# Patient Record
Sex: Female | Born: 1955 | Race: White | Hispanic: No | Smoking: Current every day smoker
Health system: Southern US, Community
[De-identification: ages and names within clinical notes are randomized; demographics above are authoritative.]

## PROBLEM LIST (undated history)

## (undated) DIAGNOSIS — E079 Disorder of thyroid, unspecified: Secondary | ICD-10-CM

## (undated) DIAGNOSIS — E0500 Thyrotoxicosis with diffuse goiter without thyrotoxic crisis or storm: Secondary | ICD-10-CM

## (undated) DIAGNOSIS — M810 Age-related osteoporosis without current pathological fracture: Secondary | ICD-10-CM

## (undated) HISTORY — DX: Age-related osteoporosis without current pathological fracture: M81.0

---

## 2007-12-31 ENCOUNTER — Inpatient Hospital Stay (HOSPITAL_COMMUNITY): Admission: EM | Admit: 2007-12-31 | Discharge: 2008-01-01 | Payer: Self-pay | Admitting: Emergency Medicine

## 2010-10-30 NOTE — Consult Note (Signed)
Jennifer [name], Jennifer [name]            ACCOUNT NO.:  [id number]   MEDICAL RECORD NO.:  [id number]          PATIENT TYPE:  INP   LOCATION:  4707                         FACILITY:  [facility]   PHYSICIAN:  Alfonse Alpers. Gegick, M.D.DATE OF BIRTH:  1955-09-02   DATE OF CONSULTATION:  DATE OF DISCHARGE:                                 CONSULTATION   HISTORY:  This is a 55 year old woman who has noted that she has been  able to eat without gaining weight for approximately 2 months.  Approximately, 2 weeks, she has noted symptoms of palpitations and she  presents with symptoms of palpitations at this time.  She denies any  history of chest pain.  She was evaluated with a cardiac stress Jennifer [name]  which was negative.  She feels better than she previously felt.   There is no history of weight gain or weight loss.  She does have  symptoms of being able to eat without gaining weight.  She denies any  change in her eyes.  There is no history of medication-induced changes  and she has felt reasonably well previously.  Her past medical history  is essentially negative.  She has not been taking any medications.   PERSONAL HISTORY:  She smokes, but is trying to discontinue that at this  time.  (She was advised strongly important it was for her eyes not to  smoke).  She denies excessive alcoholic intake.  No history of  allergies.   REVIEW OF SYSTEMS:  Her weight has remained stable.  Cardiovascular,  respiratory see above.  GI:  No complaints.  GU:  No complaints.   PHYSICAL EXAMINATION:  GENERAL:  This is a well-developed woman who  appears in no distress at this time.  Her skin is normal.  Her head is  normocephalic.  Her eyes show slight stare to be present.  No  exophthalmos is present.  Her lungs are clear and cardiovascular rhythm  is regular.  Her abdomen is negative.  The neck is supple.  The thyroid  is slightly enlarged.  No nodules are palpable.   IMPRESSION:  Hyperthyroidism (Graves disease).   DISCUSSION:  At this time, she is going to be taking metoprolol 25 mg  b.i.d. at bedtime for her rate controlled.  She feels well and is  clinically stable.  I explained to her the various forms of therapy for  hyperthyroidism and she elected to take radioactive iodine.  She will  call the office on Monday.  Arrangements will be made for an uptake and  scan and this will be followed by a radioactive iodine therapy.           ______________________________  Alfonse Alpers. Dagoberto Ligas, M.D.     CGG/MEDQ  D:  01/01/2008  T:  01/02/2008  Job:  [id number]

## 2010-11-02 NOTE — Discharge Summary (Signed)
[name], [name] NO.:  [id number]   MEDICAL RECORD NO.:  [id number]          PATIENT TYPE:  INP   LOCATION:  4707                         FACILITY:  [facility]   PHYSICIAN:  Nanetta Batty, M.D.   DATE OF BIRTH:  [birth year]   DATE OF ADMISSION:  12/31/2007  DATE OF DISCHARGE:  01/01/2008                               DISCHARGE SUMMARY   DISCHARGE DIAGNOSES:  1. Chest pain with normal coronaries and normal left ventricular      function.  2. History of smoking.  3. Hyperthyroid by TSH, but no symptoms, seen by Dr. Corrin Parker      this admission.   HOSPITAL COURSE:  The patient is a 55 year old female with no prior  history of coronary artery disease, presented on December 31, 2007, with  chest pain worrisome for unstable angina.  She was sent by Dr. Chilton Si at  [facility].  Her symptoms are worrisome for unstable angina.  She was  seen by Dr. Clarene Duke.  Enzymes and D-dimer were negative.  EKG showed no  acute changes.  She had an exercise Myoview study on January 01, 2008,  which showed normal LV function and no ischemia.  She was seen in  consult by Dr. Dagoberto Ligas before discharge.  She was noted to be  hyperthyroid with a TSH of 0.004.  He will follow her up as an  outpatient.   DISCHARGE MEDICATIONS:  1. Metoprolol 25 mg twice a day.  2. Coated aspirin 325 mg a day.  3. Prilosec OTC once a day.   LABORATORY DATA:  EKG showed sinus rhythm without acute changes.  White  count 9.9, hemoglobin 12.7, hematocrit 37.3, and platelets 264.  INR  1.0.  Sodium 141, potassium 4.0, BUN 11, and creatinine 0.4.  LFTs were  normal.  CK-MBs were negative.  Troponins were negative.  LDL was 124.  TSH was less than 0.004.   DISPOSITION:  The patient is discharged in stable condition.  She will  follow up with Dr. Dagoberto Ligas and Dr. Nanetta Batty.      Abelino Derrick, P.A.      Nanetta Batty, M.D.  Electronically Signed    LKK/MEDQ  D:  02/15/2008  T:  02/16/2008  Job:   [id number]   cc:   Alfonse Alpers. Dagoberto Ligas, M.D.

## 2011-03-15 LAB — COMPREHENSIVE METABOLIC PANEL
ALT: 21
AST: 20
CO2: 24
Chloride: 110
Creatinine, Ser: 0.48
GFR calc Af Amer: >60
GFR calc non Af Amer: >60
Total Bilirubin: 0.5

## 2011-03-15 LAB — CBC
HCT: 37.3
HCT: 38.6
Hemoglobin: 12.7
MCHC: 34
MCV: 85.2
Platelets: 290
RDW: 13.3
WBC: 8.9

## 2011-03-15 LAB — CARDIAC PANEL(CRET KIN+CKTOT+MB+TROPI)
CK, MB: 2.1
Relative Index: INVALID
Total CK: 87
Troponin I: <0.01

## 2011-03-15 LAB — DIFFERENTIAL
Basophils Absolute: 0.1
Basophils Relative: 1
Eosinophils Absolute: 0.2
Eosinophils Relative: 2
Lymphocytes Relative: 40

## 2011-03-15 LAB — HEPARIN LEVEL (UNFRACTIONATED): Heparin Unfractionated: 0.36

## 2011-03-15 LAB — LIPID PANEL
HDL: 42
Total CHOL/HDL Ratio: 4.4
VLDL: 19

## 2011-03-15 LAB — D-DIMER, QUANTITATIVE: D-Dimer, Quant: 0.27

## 2012-09-14 ENCOUNTER — Encounter (HOSPITAL_COMMUNITY): Payer: Self-pay | Admitting: *Deleted

## 2012-09-14 ENCOUNTER — Emergency Department (HOSPITAL_COMMUNITY)
Admission: EM | Admit: 2012-09-14 | Discharge: 2012-09-14 | Disposition: A | Discharge status: Home / Self Care | Payer: Self-pay | Attending: Emergency Medicine | Admitting: Emergency Medicine

## 2012-09-14 DIAGNOSIS — Z862 Personal history of diseases of the blood and blood-forming organs and certain disorders involving the immune mechanism: Secondary | ICD-10-CM | POA: Insufficient documentation

## 2012-09-14 DIAGNOSIS — M25519 Pain in unspecified shoulder: Secondary | ICD-10-CM | POA: Insufficient documentation

## 2012-09-14 DIAGNOSIS — F17200 Nicotine dependence, unspecified, uncomplicated: Secondary | ICD-10-CM | POA: Insufficient documentation

## 2012-09-14 DIAGNOSIS — M25511 Pain in right shoulder: Secondary | ICD-10-CM

## 2012-09-14 DIAGNOSIS — Z8639 Personal history of other endocrine, nutritional and metabolic disease: Secondary | ICD-10-CM | POA: Insufficient documentation

## 2012-09-14 HISTORY — DX: Disorder of thyroid, unspecified: E07.9

## 2012-09-14 MED ORDER — OXYCODONE-ACETAMINOPHEN 5-325 MG PO TABS: 1.0000 | ORAL_TABLET | ORAL | Status: DC | PRN

## 2012-09-14 NOTE — ED Notes (Signed)
Three months ago she began experiencing shoulder pain.  She is able to function but it is getting progressively worse.  The patent said now the pain is radiating down her right arm and her right leg.  She is able to move her right arm but has limited range of motion because it hurts.  She denies injury.

## 2012-09-14 NOTE — ED Notes (Signed)
Pt woke up 1 morning 3 months ago with R shoulder pain.  Since then it is becoming increasingly painful, esp when she raises her arm.

## 2012-09-14 NOTE — ED Provider Notes (Signed)
History  This chart was scribed for Dione Booze, MD by Shari Heritage, ED Scribe. The patient was seen in room TR09C/TR09C. Patient's care was started at 1616.  CSN: [id number]  Arrival date & time 09/14/12  1527   First MD Initiated Contact with Patient 09/14/12 1616      Chief Complaint  Patient presents with  . Shoulder Pain    muscular     The history is provided by the patient. No language interpreter was used.    HPI Comments: Jennifer [name] is a 57 y.o. female who presents to the Emergency Department complaining of moderate, waxing and waning, right shoulder pain that radiates down her arm onset 1-2 days ago. Patient states that she has had this recurrent pain for the past 3 months, but it has worsened over the past couple of days. Patient rates pain as 3/10 at current. At worst, pain is 8/10. Pain is worse with movement of the shoulder. She says that sometimes pain is so severe that it wakes her from sleep at night. She denies any obvious injury or trauma prior to the onset of pain. Patient says that she has taken Tylenol at home with only minimal relief. There is no nausea, vomiting, fever, chills, numbness or weakness. Patient is a current every day smoker.    Past Medical History  Diagnosis Date  . Thyroid disease     graves    History reviewed. No pertinent past surgical history.  No family history on file.  History  Substance Use Topics  . Smoking status: Current Every Day Smoker -- 1.50 packs/day    Types: Cigarettes  . Smokeless tobacco: Not on file  . Alcohol Use: Yes     Comment: occasionally    OB History   Grav Para Term Preterm Abortions TAB SAB Ect Mult Living                  Review of Systems  Constitutional: Negative for fever and chills.  Gastrointestinal: Negative for nausea and vomiting.  Neurological: Negative for weakness and numbness.    Allergies  Review of patient's allergies indicates no known allergies.  Home Medications  No  current outpatient prescriptions on file.  Triage Vitals: BP 159/75  Pulse 96  Temp(Src) 98.8 F (37.1 C) (Oral)  Resp 16  SpO2 98%  Physical Exam  Constitutional: She is oriented to person, place, and time. She appears well-developed and well-nourished.  HENT:  Head: Normocephalic and atraumatic.  Eyes: Conjunctivae and EOM are normal. Pupils are equal, round, and reactive to light.  Neck: Normal range of motion. Neck supple.  Cardiovascular: Normal rate, regular rhythm and normal heart sounds.   Pulmonary/Chest: Effort normal and breath sounds normal.  Musculoskeletal:       Right shoulder: She exhibits tenderness and pain.  Mild tenderness intramedial aspect of the right humeral head. Pain and marked limitation of ROM with both passive and active range of motion of right shoulder. Neurovascularly intact distally.  Neurological: She is alert and oriented to person, place, and time.  Skin: Skin is warm and dry.  Psychiatric: She has a normal mood and affect. Her behavior is normal.    ED Course  Procedures (including critical care time) DIAGNOSTIC STUDIES: Oxygen Saturation is 898% on room air, normal by my interpretation.    COORDINATION OF CARE: 4:23 PM- Patient informed of current plan for treatment and evaluation and agrees with plan at this time.    1. Pain in right shoulder  MDM  Shoulder pain with limitation in active and passive range of motion most consistent with adhesive capsulitis. I do not see indications for imaging at this time. She's advised to take over-the-counter naproxen and is given prescription for Percocet and is referred to on-call orthopedics.      I personally performed the services described in this documentation, which was scribed in my presence. The recorded information has been reviewed and is accurate.      Dione Booze, MD 09/14/12 1630

## 2012-11-26 ENCOUNTER — Emergency Department (HOSPITAL_COMMUNITY): Payer: Self-pay

## 2012-11-26 ENCOUNTER — Emergency Department (HOSPITAL_COMMUNITY)
Admission: EM | Admit: 2012-11-26 | Discharge: 2012-11-26 | Disposition: A | Discharge status: Home / Self Care | Payer: Self-pay | Attending: Emergency Medicine | Admitting: Emergency Medicine

## 2012-11-26 ENCOUNTER — Encounter (HOSPITAL_COMMUNITY): Payer: Self-pay | Admitting: Emergency Medicine

## 2012-11-26 DIAGNOSIS — R110 Nausea: Secondary | ICD-10-CM | POA: Insufficient documentation

## 2012-11-26 DIAGNOSIS — R002 Palpitations: Secondary | ICD-10-CM | POA: Insufficient documentation

## 2012-11-26 DIAGNOSIS — F17200 Nicotine dependence, unspecified, uncomplicated: Secondary | ICD-10-CM | POA: Insufficient documentation

## 2012-11-26 DIAGNOSIS — R0602 Shortness of breath: Secondary | ICD-10-CM | POA: Insufficient documentation

## 2012-11-26 DIAGNOSIS — E0500 Thyrotoxicosis with diffuse goiter without thyrotoxic crisis or storm: Secondary | ICD-10-CM | POA: Insufficient documentation

## 2012-11-26 DIAGNOSIS — Z862 Personal history of diseases of the blood and blood-forming organs and certain disorders involving the immune mechanism: Secondary | ICD-10-CM | POA: Insufficient documentation

## 2012-11-26 DIAGNOSIS — Z8639 Personal history of other endocrine, nutritional and metabolic disease: Secondary | ICD-10-CM | POA: Insufficient documentation

## 2012-11-26 HISTORY — DX: Thyrotoxicosis with diffuse goiter without thyrotoxic crisis or storm: E05.00

## 2012-11-26 LAB — BASIC METABOLIC PANEL
BUN: 7 mg/dL (ref 6–23)
Chloride: 105 mEq/L (ref 96–112)
GFR calc Af Amer: >90 mL/min (ref >90)
GFR calc non Af Amer: >90 mL/min (ref >90)
Potassium: 4.3 mEq/L (ref 3.5–5.1)
Sodium: 137 mEq/L (ref 135–145)

## 2012-11-26 LAB — URINALYSIS, ROUTINE W REFLEX MICROSCOPIC
Leukocytes, UA: NEGATIVE
Nitrite: NEGATIVE
Specific Gravity, Urine: 1.01 (ref 1.005–1.030)
Urobilinogen, UA: 0.2 mg/dL (ref 0.0–1.0)

## 2012-11-26 LAB — TSH: TSH: 0.02 u[IU]/mL — ABNORMAL LOW (ref 0.350–4.500)

## 2012-11-26 LAB — CBC WITH DIFFERENTIAL/PLATELET
Basophils Relative: 0 % (ref 0–1)
Hemoglobin: 14.3 g/dL (ref 12.0–15.0)
MCHC: 34.8 g/dL (ref 30.0–36.0)
Monocytes Relative: 7 % (ref 3–12)
Neutro Abs: 11.3 10*3/uL — ABNORMAL HIGH (ref 1.7–7.7)
Neutrophils Relative %: 72 % (ref 43–77)
Platelets: 307 10*3/uL (ref 150–400)
RBC: 4.61 MIL/uL (ref 3.87–5.11)

## 2012-11-26 LAB — D-DIMER, QUANTITATIVE: D-Dimer, Quant: <0.27 ug/mL-FEU (ref 0.00–0.48)

## 2012-11-26 NOTE — ED Notes (Signed)
Pt reports rapid heart rate X1 week and had associated midchest pain. Rapid heart rate increases in morning.  Pt has hx of same but has not been taking her meds.  Pt is currently pain free.

## 2012-11-26 NOTE — ED Provider Notes (Signed)
History     CSN: [id number]  Arrival date & time 11/26/12  8469   First MD Initiated Contact with Patient 11/26/12 0840      Chief Complaint  Patient presents with  . Chest Pain    (Consider location/radiation/quality/duration/timing/severity/associated sxs/prior treatment) HPI Comments: Patient is a 57 year old female who presents today with one week of palpitations with 30 minutes of associated sharp, nonradiating substernal chest pain earlier today. Associated with the chest pain that was nausea, shortness of breath. She has a history of hyperthyroidism and does not take her medications because she states she cannot afford them. She had a cardiac workup with echo and stress test 5 years ago which was negative. No weight loss, hair loss, fevers, chills, recent illness. No vomiting or abdominal pain.  Patient is a 57 y.o. female presenting with chest pain. The history is provided by the patient. No language interpreter was used.  Chest Pain Associated symptoms: shortness of breath   Associated symptoms: no fever     Past Medical History  Diagnosis Date  . Thyroid disease     graves  . Graves disease     History reviewed. No pertinent past surgical history.  History reviewed. No pertinent family history.  History  Substance Use Topics  . Smoking status: Current Every Day Smoker -- 1.50 packs/day    Types: Cigarettes  . Smokeless tobacco: Not on file  . Alcohol Use: Yes     Comment: occasionally    OB History   Grav Para Term Preterm Abortions TAB SAB Ect Mult Living                  Review of Systems  Constitutional: Negative for fever, chills and unexpected weight change.  Respiratory: Positive for shortness of breath.   Cardiovascular: Positive for chest pain.  All other systems reviewed and are negative.    Allergies  Review of patient's allergies indicates no known allergies.  Home Medications   Current Outpatient Rx  Name  Route  Sig  Dispense   Refill  . oxyCODONE-acetaminophen (PERCOCET/ROXICET) 5-325 MG per tablet   Oral   Take 1 tablet by mouth every 4 (four) hours as needed for pain.   20 tablet   0     BP 156/117  Pulse 86  Temp(Src) 98.2 F (36.8 C) (Oral)  Resp 18  SpO2 96%  Physical Exam  Nursing note and vitals reviewed. Constitutional: She is oriented to person, place, and time. She appears well-developed and well-nourished. No distress.  HENT:  Head: Normocephalic and atraumatic.  Right Ear: External ear normal.  Left Ear: External ear normal.  Nose: Nose normal.  Mouth/Throat: Oropharynx is clear and moist.  Eyes: Conjunctivae are normal.  Neck: Normal range of motion.  Cardiovascular: Normal rate, regular rhythm and normal heart sounds.   Pulmonary/Chest: Effort normal and breath sounds normal. No stridor. No respiratory distress. She has no wheezes. She has no rales.  Abdominal: Soft. She exhibits no distension. There is tenderness in the left lower quadrant.  Musculoskeletal: Normal range of motion.  Neurological: She is alert and oriented to person, place, and time. She has normal strength.  Skin: Skin is warm and dry. She is not diaphoretic. No erythema.  Psychiatric: She has a normal mood and affect. Her behavior is normal.    ED Course  Procedures (including critical care time)  Labs Reviewed  CBC WITH DIFFERENTIAL - Abnormal; Notable for the following:    WBC 15.6 (*)  Neutro Abs 11.3 (*)    Monocytes Absolute 1.1 (*)    All other components within normal limits  BASIC METABOLIC PANEL  D-DIMER, QUANTITATIVE  URINALYSIS, ROUTINE W REFLEX MICROSCOPIC  TSH  POCT I-STAT TROPONIN I   Dg Chest 2 View  11/26/2012   *RADIOLOGY REPORT*  Clinical Data: Sternal chest pain.  CHEST - 2 VIEW  Comparison: 12/31/2007.  Findings: Trachea is midline. Heart size normal.  Lungs are hyperinflated but clear.  No pleural fluid. Sternum is unremarkable.  IMPRESSION: Hyperinflation without acute finding.    Original Report Authenticated By: Leanna Battles, M.D.     Date: 11/26/2012  Rate: 90  Rhythm: normal sinus rhythm and premature ventricular contractions (PVC)  QRS Axis: normal  Intervals: normal  ST/T Wave abnormalities: normal  Conduction Disutrbances:none  Narrative Interpretation:   Old EKG Reviewed: unchanged    1. Graves disease   2. Palpitations       MDM  Patient presents with chest pain since this morning and palpitations x 1 week. Hyperthyroid without taking medication as she states she cannot afford it. States she has felt like this is the past. Neg stress test 5 years ago. Today CXR, troponin, d-dimer negative. WBC high. UA negative. It is very important to establish care with a pcp. Cardio consult given. Make appointment for outpatient follow up. Return instructions given. Vital signs stable for dishcarge. Dr. Ranae Palms evaluated patient and agrees with plan. Patient / Family / Caregiver informed of clinical course, understand medical decision-making process, and agree with plan.         Mora Bellman, PA-C 11/26/12 1531

## 2012-11-26 NOTE — ED Notes (Signed)
Pt c/o palpitations x 1 week intermittently; pt sts some mid sternal CP this am

## 2012-11-26 NOTE — ED Provider Notes (Signed)
Medical screening examination/treatment/procedure(s) were conducted as a shared visit with non-physician practitioner(s) and myself.  I personally evaluated the patient during the encounter   Loren Racer, MD 11/26/12 1557

## 2012-11-26 NOTE — ED Notes (Signed)
Patient transported to X-ray 

## 2022-01-02 ENCOUNTER — Ambulatory Visit (INDEPENDENT_AMBULATORY_CARE_PROVIDER_SITE_OTHER): Payer: Medicare HMO | Admitting: Registered Nurse

## 2022-01-02 ENCOUNTER — Encounter: Payer: Self-pay | Admitting: Registered Nurse

## 2022-01-02 VITALS — BP 136/82 | HR 70 | Temp 97.3°F | Resp 16 | Ht 65.0 in | Wt 124.4 lb

## 2022-01-02 DIAGNOSIS — Z1231 Encounter for screening mammogram for malignant neoplasm of breast: Secondary | ICD-10-CM

## 2022-01-02 DIAGNOSIS — Z800 Family history of malignant neoplasm of digestive organs: Secondary | ICD-10-CM

## 2022-01-02 DIAGNOSIS — E2839 Other primary ovarian failure: Secondary | ICD-10-CM | POA: Diagnosis not present

## 2022-01-02 DIAGNOSIS — R5382 Chronic fatigue, unspecified: Secondary | ICD-10-CM | POA: Diagnosis not present

## 2022-01-02 DIAGNOSIS — Z87891 Personal history of nicotine dependence: Secondary | ICD-10-CM

## 2022-01-02 DIAGNOSIS — Z8639 Personal history of other endocrine, nutritional and metabolic disease: Secondary | ICD-10-CM

## 2022-01-02 LAB — URINALYSIS, ROUTINE W REFLEX MICROSCOPIC
Bilirubin Urine: NEGATIVE
Ketones, ur: NEGATIVE
Nitrite: NEGATIVE
Specific Gravity, Urine: 1.015 (ref 1.000–1.030)
Total Protein, Urine: NEGATIVE
Urine Glucose: NEGATIVE
Urobilinogen, UA: 0.2 (ref 0.0–1.0)
pH: 6 (ref 5.0–8.0)

## 2022-01-02 LAB — LIPID PANEL
Cholesterol: 200 mg/dL (ref 0–200)
HDL: 53.8 mg/dL (ref >39.00)
LDL Cholesterol: 130 mg/dL — ABNORMAL HIGH (ref 0–99)
NonHDL: 146.45
Total CHOL/HDL Ratio: 4
Triglycerides: 82 mg/dL (ref 0.0–149.0)
VLDL: 16.4 mg/dL (ref 0.0–40.0)

## 2022-01-02 LAB — CBC WITH DIFFERENTIAL/PLATELET
Basophils Absolute: 0.1 10*3/uL (ref 0.0–0.1)
Basophils Relative: 1 % (ref 0.0–3.0)
Eosinophils Absolute: 0.1 10*3/uL (ref 0.0–0.7)
Eosinophils Relative: 1.5 % (ref 0.0–5.0)
HCT: 43.4 % (ref 36.0–46.0)
Hemoglobin: 14.4 g/dL (ref 12.0–15.0)
Lymphocytes Relative: 21.8 % (ref 12.0–46.0)
Lymphs Abs: 2.2 10*3/uL (ref 0.7–4.0)
MCHC: 33.2 g/dL (ref 30.0–36.0)
MCV: 92.5 fl (ref 78.0–100.0)
Monocytes Absolute: 0.7 10*3/uL (ref 0.1–1.0)
Monocytes Relative: 6.9 % (ref 3.0–12.0)
Neutro Abs: 6.8 10*3/uL (ref 1.4–7.7)
Neutrophils Relative %: 68.8 % (ref 43.0–77.0)
Platelets: 289 10*3/uL (ref 150.0–400.0)
RBC: 4.69 Mil/uL (ref 3.87–5.11)
RDW: 13.8 % (ref 11.5–15.5)
WBC: 9.9 10*3/uL (ref 4.0–10.5)

## 2022-01-02 LAB — COMPREHENSIVE METABOLIC PANEL
ALT: 15 U/L (ref 0–35)
AST: 20 U/L (ref 0–37)
Albumin: 4.6 g/dL (ref 3.5–5.2)
Alkaline Phosphatase: 57 U/L (ref 39–117)
BUN: 13 mg/dL (ref 6–23)
CO2: 26 mEq/L (ref 19–32)
Calcium: 9.3 mg/dL (ref 8.4–10.5)
Chloride: 105 mEq/L (ref 96–112)
Creatinine, Ser: 0.65 mg/dL (ref 0.40–1.20)
GFR: 92.61 mL/min (ref >60.00)
Glucose, Bld: 99 mg/dL (ref 70–99)
Potassium: 3.6 mEq/L (ref 3.5–5.1)
Sodium: 139 mEq/L (ref 135–145)
Total Bilirubin: 0.9 mg/dL (ref 0.2–1.2)
Total Protein: 7 g/dL (ref 6.0–8.3)

## 2022-01-02 LAB — TSH: TSH: 0.09 u[IU]/mL — ABNORMAL LOW (ref 0.35–5.50)

## 2022-01-02 LAB — HEMOGLOBIN A1C: Hgb A1c MFr Bld: 5.7 % (ref 4.6–6.5)

## 2022-01-02 LAB — T4, FREE: Free T4: 1.18 ng/dL (ref 0.60–1.60)

## 2022-01-02 NOTE — Patient Instructions (Signed)
Jennifer [name] -   Pleasure to meet you  Looking forward to figuring out what's up with this fatigue!  I'll let you know how lab results look as soon as I have results - should be this afternoon.  We'll plan on follow up from there.  You will get phone calls about colonoscopy, mammogram, dexa scan (bone density), and lung cancer screening.   Thank you!  Rich

## 2022-01-02 NOTE — Progress Notes (Signed)
New Patient Office Visit  Subjective:  Patient ID: Jennifer [name], female    DOB: 24-Sep-1955  Age: 66 y.o. MRN: [id number]  CC:  Chief Complaint  Patient presents with   Establish Care    Est care  Pt states she has Graves disease  Fatigued more than usual     HPI Jennifer [name] presents to establish care Histories reviewed and updated with patient.   Fatigue Hx of Graves Disease Has felt more fatigue around 6 mo ago, worsened around 3 mo ago.  Feels different than history of thyroid dysfunction. Denies weight changes. Does note she is overdue for glasses rx update - does have some headaches. She does endorse some diarrhea but no blood or mucus in stool.   No other symptoms of note  Does have 50 pack year smoking history, active smoker. Her father was dx with colon cancer.  No outpatient encounter medications on file as of 01/02/2022.   No facility-administered encounter medications on file as of 01/02/2022.    Past Medical History:  Diagnosis Date   Graves disease    Graves disease    Thyroid disease    graves    History reviewed. No pertinent surgical history.  Family History  Problem Relation Age of Onset   Cancer - Colon Mother    Alzheimer's disease Mother    Heart disease Father    Cancer - Lung Father     Social History   Socioeconomic History   Marital status: Divorced    Spouse name: Not on file   Number of children: Not on file   Years of education: Not on file   Highest education level: Not on file  Occupational History   Not on file  Tobacco Use   Smoking status: Every Day    Packs/day: 1.50    Types: Cigarettes   Smokeless tobacco: Not on file  Substance and Sexual Activity   Alcohol use: Yes    Alcohol/week: 2.0 standard drinks of alcohol    Types: 2 Cans of beer per week    Comment: occasionally   Drug use: No   Sexual activity: Not Currently  Other Topics Concern   Not on file  Social History Narrative   Not on file    Social Determinants of Health   Financial Resource Strain: Not on file  Food Insecurity: Not on file  Transportation Needs: Not on file  Physical Activity: Not on file  Stress: Not on file  Social Connections: Not on file  Intimate Partner Violence: Not on file    ROS Review of Systems  Constitutional: Negative.   HENT: Negative.    Eyes: Negative.   Respiratory: Negative.    Cardiovascular: Negative.   Gastrointestinal: Negative.   Genitourinary: Negative.   Musculoskeletal: Negative.   Skin: Negative.   Neurological: Negative.   Psychiatric/Behavioral: Negative.    All other systems reviewed and are negative.   Objective:   Today's Vitals: BP 136/82   Pulse 70   Temp (!) 97.3 F (36.3 C) (Temporal)   Resp 16   Ht '5\' 5"'$  (1.651 m)   Wt 124 lb 6 oz (56.4 kg)   SpO2 98%   BMI 20.70 kg/m   Physical Exam Vitals and nursing note reviewed.  Constitutional:      General: She is not in acute distress.    Appearance: Normal appearance. She is normal weight. She is not ill-appearing, toxic-appearing or diaphoretic.  Neck:     Vascular: No carotid  bruit.  Cardiovascular:     Rate and Rhythm: Normal rate and regular rhythm.     Heart sounds: Normal heart sounds. No murmur heard.    No friction rub. No gallop.  Pulmonary:     Effort: Pulmonary effort is normal. No respiratory distress.     Breath sounds: No stridor. Wheezing (panlobular) present. No rhonchi or rales.  Chest:     Chest wall: No tenderness.  Musculoskeletal:     Cervical back: Normal range of motion and neck supple. No rigidity or tenderness.  Lymphadenopathy:     Cervical: No cervical adenopathy.  Skin:    General: Skin is warm and dry.  Neurological:     General: No focal deficit present.     Mental Status: She is alert and oriented to person, place, and time. Mental status is at baseline.  Psychiatric:        Mood and Affect: Mood normal.        Behavior: Behavior normal.        Thought  Content: Thought content normal.        Judgment: Judgment normal.         Assessment & Plan:   Problem List Items Addressed This Visit   None   Follow-up: No follow-ups on file.   PLAN - panlobular wheezing consistent with suspected COPD. Will refer to lung ca screening. - thyroid normal on exam. Will check labs. No previous results available. - Refer to screening colonoscopy - order screening mammography and dexa scan - Labs collected. Will follow up with the patient as warranted. Patient encouraged to call clinic with any questions, comments, or concerns.   Maximiano Coss, NP

## 2022-01-15 HISTORY — PX: OTHER SURGICAL HISTORY: SHX169

## 2022-02-05 ENCOUNTER — Other Ambulatory Visit: Payer: Self-pay | Admitting: Registered Nurse

## 2022-02-05 ENCOUNTER — Ambulatory Visit
Admission: RE | Admit: 2022-02-05 | Discharge: 2022-02-05 | Disposition: A | Discharge status: Home / Self Care | Payer: Medicare HMO | Source: Ambulatory Visit | Attending: Registered Nurse | Admitting: Registered Nurse

## 2022-02-05 DIAGNOSIS — Z1231 Encounter for screening mammogram for malignant neoplasm of breast: Secondary | ICD-10-CM

## 2022-02-05 DIAGNOSIS — M8588 Other specified disorders of bone density and structure, other site: Secondary | ICD-10-CM | POA: Diagnosis not present

## 2022-02-05 DIAGNOSIS — M810 Age-related osteoporosis without current pathological fracture: Secondary | ICD-10-CM | POA: Diagnosis not present

## 2022-02-05 DIAGNOSIS — Z780 Asymptomatic menopausal state: Secondary | ICD-10-CM | POA: Diagnosis not present

## 2022-02-05 DIAGNOSIS — E2839 Other primary ovarian failure: Secondary | ICD-10-CM

## 2022-02-06 ENCOUNTER — Telehealth: Payer: Self-pay

## 2022-02-06 ENCOUNTER — Other Ambulatory Visit: Payer: Self-pay | Admitting: Family Medicine

## 2022-02-06 MED ORDER — ALENDRONATE SODIUM 70 MG PO TABS: 70.0000 mg | ORAL_TABLET | ORAL | 11 refills | Status: DC

## 2022-02-06 MED ORDER — ALENDRONATE SODIUM 70 MG PO TABS: 70.0000 mg | ORAL_TABLET | ORAL | 3 refills | Status: AC

## 2022-02-06 NOTE — Telephone Encounter (Signed)
Pt called back and informed.

## 2022-02-06 NOTE — Telephone Encounter (Signed)
Fosamax sent for pt

## 2022-02-06 NOTE — Telephone Encounter (Signed)
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  Based on this, we will start Fosamax '70mg'$  weekly and make sure you are taking a Vit D and Calcium supplement daily.

## 2022-02-08 ENCOUNTER — Other Ambulatory Visit: Payer: Self-pay | Admitting: Registered Nurse

## 2022-02-08 ENCOUNTER — Encounter: Payer: Self-pay | Admitting: Gastroenterology

## 2022-02-08 DIAGNOSIS — R928 Other abnormal and inconclusive findings on diagnostic imaging of breast: Secondary | ICD-10-CM

## 2022-02-21 ENCOUNTER — Other Ambulatory Visit: Payer: Self-pay

## 2022-02-21 ENCOUNTER — Ambulatory Visit (AMBULATORY_SURGERY_CENTER): Payer: Self-pay

## 2022-02-21 VITALS — Ht 65.0 in | Wt 128.0 lb

## 2022-02-21 DIAGNOSIS — Z1211 Encounter for screening for malignant neoplasm of colon: Secondary | ICD-10-CM

## 2022-02-21 MED ORDER — NA SULFATE-K SULFATE-MG SULF 17.5-3.13-1.6 GM/177ML PO SOLN: 1.0000 | Freq: Once | ORAL | 0 refills | Status: AC

## 2022-02-21 NOTE — Progress Notes (Signed)
Denies allergies to eggs or soy products. Denies complication of anesthesia or sedation. Denies use of weight loss medication. Denies use of O2.   Emmi instructions given for colonoscopy.  

## 2022-02-25 ENCOUNTER — Telehealth: Payer: Self-pay | Admitting: Gastroenterology

## 2022-02-25 DIAGNOSIS — Z1211 Encounter for screening for malignant neoplasm of colon: Secondary | ICD-10-CM

## 2022-02-25 MED ORDER — PEG 3350-KCL-NA BICARB-NACL 420 G PO SOLR: 4000.0000 mL | Freq: Once | ORAL | 0 refills | Status: AC

## 2022-02-25 NOTE — Telephone Encounter (Signed)
Inbound call from patient stating she was not able to get her prep from the pharmacy and that they mentioned that she needs a alternative sent in. Please advise.

## 2022-02-25 NOTE — Telephone Encounter (Signed)
I spoke with the patient and explained that she may use good rx for coupon to bring cost to $30-40, pt states that would be a problem. Switched prep to [employer] rx sent and new instructions mailed to pt. Pt is aware, she will call us with any questions.

## 2022-02-28 DIAGNOSIS — E0580 Other thyrotoxicosis without thyrotoxic crisis or storm: Secondary | ICD-10-CM | POA: Diagnosis not present

## 2022-02-28 DIAGNOSIS — H2513 Age-related nuclear cataract, bilateral: Secondary | ICD-10-CM | POA: Diagnosis not present

## 2022-02-28 DIAGNOSIS — H348112 Central retinal vein occlusion, right eye, stable: Secondary | ICD-10-CM | POA: Diagnosis not present

## 2022-03-05 ENCOUNTER — Encounter: Payer: Self-pay | Admitting: Gastroenterology

## 2022-03-06 ENCOUNTER — Ambulatory Visit: Admission: RE | Admit: 2022-03-06 | Payer: Medicare HMO | Source: Ambulatory Visit

## 2022-03-06 ENCOUNTER — Ambulatory Visit: Payer: Medicare HMO

## 2022-03-06 ENCOUNTER — Ambulatory Visit
Admission: RE | Admit: 2022-03-06 | Discharge: 2022-03-06 | Disposition: A | Discharge status: Home / Self Care | Payer: Medicare HMO | Source: Ambulatory Visit | Attending: Registered Nurse | Admitting: Registered Nurse

## 2022-03-06 ENCOUNTER — Other Ambulatory Visit: Payer: Self-pay | Admitting: Registered Nurse

## 2022-03-06 DIAGNOSIS — R921 Mammographic calcification found on diagnostic imaging of breast: Secondary | ICD-10-CM | POA: Diagnosis not present

## 2022-03-06 DIAGNOSIS — R928 Other abnormal and inconclusive findings on diagnostic imaging of breast: Secondary | ICD-10-CM

## 2022-03-20 ENCOUNTER — Ambulatory Visit (AMBULATORY_SURGERY_CENTER): Payer: Medicare HMO | Admitting: Gastroenterology

## 2022-03-20 ENCOUNTER — Encounter: Payer: Self-pay | Admitting: Gastroenterology

## 2022-03-20 VITALS — BP 132/60 | HR 76 | Temp 97.8°F | Resp 16 | Ht 65.0 in | Wt 128.0 lb

## 2022-03-20 DIAGNOSIS — Z800 Family history of malignant neoplasm of digestive organs: Secondary | ICD-10-CM | POA: Diagnosis not present

## 2022-03-20 DIAGNOSIS — D123 Benign neoplasm of transverse colon: Secondary | ICD-10-CM | POA: Diagnosis not present

## 2022-03-20 DIAGNOSIS — D128 Benign neoplasm of rectum: Secondary | ICD-10-CM

## 2022-03-20 DIAGNOSIS — D124 Benign neoplasm of descending colon: Secondary | ICD-10-CM

## 2022-03-20 DIAGNOSIS — D125 Benign neoplasm of sigmoid colon: Secondary | ICD-10-CM

## 2022-03-20 DIAGNOSIS — Z1211 Encounter for screening for malignant neoplasm of colon: Secondary | ICD-10-CM | POA: Diagnosis not present

## 2022-03-20 MED ORDER — SODIUM CHLORIDE 0.9 % IV SOLN: 500.0000 mL | Freq: Once | INTRAVENOUS | Status: DC

## 2022-03-20 NOTE — Progress Notes (Addendum)
Vital signs checked by:DT  The patient states no changes in medical or surgical history since pre-visit screening on 02/21/22.

## 2022-03-20 NOTE — Patient Instructions (Signed)
Please read handouts provided. Continue present medications. Await pathology results.   YOU HAD AN ENDOSCOPIC PROCEDURE TODAY AT THE [facility]:   Refer to the procedure report that was given to you for any specific questions about what was found during the examination.  If the procedure report does not answer your questions, please call your gastroenterologist to clarify.  If you requested that your care partner not be given the details of your procedure findings, then the procedure report has been included in a sealed envelope for you to review at your convenience later.  YOU SHOULD EXPECT: Some feelings of bloating in the abdomen. Passage of more gas than usual.  Walking can help get rid of the air that was put into your GI tract during the procedure and reduce the bloating. If you had a lower endoscopy (such as a colonoscopy or flexible sigmoidoscopy) you may notice spotting of blood in your stool or on the toilet paper. If you underwent a bowel prep for your procedure, you may not have a normal bowel movement for a few days.  Please Note:  You might notice some irritation and congestion in your nose or some drainage.  This is from the oxygen used during your procedure.  There is no need for concern and it should clear up in a day or so.  SYMPTOMS TO REPORT IMMEDIATELY:  Following lower endoscopy (colonoscopy or flexible sigmoidoscopy):  Excessive amounts of blood in the stool  Significant tenderness or worsening of abdominal pains  Swelling of the abdomen that is new, acute  Fever of 100F or higher  For urgent or emergent issues, a gastroenterologist can be reached at any hour by calling [phone]. Do not use MyChart messaging for urgent concerns.    DIET:  We do recommend a small meal at first, but then you may proceed to your regular diet.  Drink plenty of fluids but you should avoid alcoholic beverages for 24 hours.  ACTIVITY:  You should plan to take it easy for  the rest of today and you should NOT DRIVE or use heavy machinery until tomorrow (because of the sedation medicines used during the test).    FOLLOW UP: Our staff will call the number listed on your records the next business day following your procedure.  We will call around 7:15- 8:00 am to check on you and address any questions or concerns that you may have regarding the information given to you following your procedure. If we do not reach you, we will leave a message.     If any biopsies were taken you will be contacted by phone or by letter within the next 1-3 weeks.  Please call us at [phone] if you have not heard about the biopsies in 3 weeks.    SIGNATURES/CONFIDENTIALITY: You and/or your care partner have signed paperwork which will be entered into your electronic medical record.  These signatures attest to the fact that that the information above on your After Visit Summary has been reviewed and is understood.  Full responsibility of the confidentiality of this discharge information lies with you and/or your care-partner. 

## 2022-03-20 NOTE — Op Note (Signed)
[name] Procedure Date: 03/20/2022 9:16 AM MRN: [id number] Endoscopist: Remo Lipps P. Havery Moros , MD Age: 66 Referring MD:  Date of Birth: [birth year] Gender: Female Account #: [id number] Procedure:                Colonoscopy Indications:              Screening in patient at increased risk: Family                            history of colon cancer (mother age 90), This is                            the patient's first colonoscopy Medicines:                Monitored Anesthesia Care Procedure:                Pre-Anesthesia Assessment:                           - Prior to the procedure, a History and Physical                            was performed, and patient medications and                            allergies were reviewed. The patient's tolerance of                            previous anesthesia was also reviewed. The risks                            and benefits of the procedure and the sedation                            options and risks were discussed with the patient.                            All questions were answered, and informed consent                            was obtained. Prior Anticoagulants: The patient has                            taken no previous anticoagulant or antiplatelet                            agents. ASA Grade Assessment: II - A patient with                            mild systemic disease. After reviewing the risks                            and benefits, the patient was deemed in  satisfactory condition to undergo the procedure.                           After obtaining informed consent, the colonoscope                            was passed under direct vision. Throughout the                            procedure, the patient's blood pressure, pulse, and                            oxygen saturations were monitored continuously. The                            Olympus PCF-H190DL  (OV#[id number]) Colonoscope was                            introduced through the anus and advanced to the the                            cecum, identified by appendiceal orifice and                            ileocecal valve. The colonoscopy was performed                            without difficulty. The patient tolerated the                            procedure well. The quality of the bowel                            preparation was adequate. The ileocecal valve,                            appendiceal orifice, and rectum were photographed. Scope In: 9:21:30 AM Scope Out: 9:47:55 AM Scope Withdrawal Time: 0 hours 18 minutes 52 seconds  Total Procedure Duration: 0 hours 26 minutes 25 seconds  Findings:                 The perianal and digital rectal examinations were                            normal.                           A diminutive polyp was found in the transverse                            colon. The polyp was sessile. The polyp was removed                            with a cold snare. Resection and retrieval were  complete.                           A 4 mm polyp was found in the sigmoid colon. The                            polyp was sessile. The polyp was removed with a                            cold snare. Resection and retrieval were complete.                           Two sessile polyps were found in the rectum. The                            polyps were 4 to 5 mm in size. These polyps were                            removed with a cold snare. Resection and retrieval                            were complete.                           Internal hemorrhoids were found during                            retroflexion. The hemorrhoids were small.                           A few medium-mouthed diverticula were found in the                            transverse colon and left colon.                           The exam was otherwise without  abnormality. Complications:            No immediate complications. Estimated blood loss:                            Minimal. Estimated Blood Loss:     Estimated blood loss was minimal. Impression:               - One diminutive polyp in the transverse colon,                            removed with a cold snare. Resected and retrieved.                           - One 4 mm polyp in the sigmoid colon, removed with                            a cold snare. Resected and retrieved.                           -  Two 4 to 5 mm polyps in the rectum, removed with                            a cold snare. Resected and retrieved.                           - Internal hemorrhoids.                           - The examination was otherwise normal.                           - Diverticulosis in the transverse colon and in the                            left colon.                           - The examination was otherwise normal. Recommendation:           - Patient has a contact number available for                            emergencies. The signs and symptoms of potential                            delayed complications were discussed with the                            patient. Return to normal activities tomorrow.                            Written discharge instructions were provided to the                            patient.                           - Resume previous diet.                           - Continue present medications.                           - Await pathology results. Remo Lipps P. Ade Stmarie, MD 03/20/2022 9:54:58 AM This report has been signed electronically.

## 2022-03-20 NOTE — Progress Notes (Signed)
A and O x3. Report to RN. Tolerated MAC anesthesia well. 

## 2022-03-20 NOTE — Progress Notes (Signed)
[facility] History and Physical   Primary Care Physician:  Maximiano Coss, NP   Reason for Procedure:   Family history of colon cancer  Plan:    colonoscopy     HPI: Jennifer [name] is a 66 y.o. female  here for colonoscopy screening - mother had colon cancer dx age 5. First time exam. Patient denies any bowel symptoms at this time.  Otherwise feels well without any cardiopulmonary symptoms.   I have discussed risks / benefits of anesthesia and endoscopic procedure with Jennifer [name] and they wish to proceed with the exams as outlined today.    Past Medical History:  Diagnosis Date   Graves disease    Graves disease    Osteoporosis    Thyroid disease    graves    History reviewed. No pertinent surgical history.  Prior to Admission medications   Medication Sig Start Date End Date Taking? Authorizing Provider  OVER THE COUNTER MEDICATION Calcium one capsule daily.   Yes [provider]  OVER THE COUNTER MEDICATION Vitamin D 3 one capsule daily.   Yes [provider]  alendronate (FOSAMAX) 70 MG tablet Take 1 tablet (70 mg total) by mouth every 7 (seven) days. Take with a full glass of water on an empty stomach. 02/06/22   Midge Minium, MD  naproxen sodium (ALEVE) 220 MG tablet Take 220 mg by mouth as needed.    [provider]    Current Outpatient Medications  Medication Sig Dispense Refill   OVER THE COUNTER MEDICATION Calcium one capsule daily.     OVER THE COUNTER MEDICATION Vitamin D 3 one capsule daily.     alendronate (FOSAMAX) 70 MG tablet Take 1 tablet (70 mg total) by mouth every 7 (seven) days. Take with a full glass of water on an empty stomach. 12 tablet 3   naproxen sodium (ALEVE) 220 MG tablet Take 220 mg by mouth as needed.     Current Facility-Administered Medications  Medication Dose Route Frequency Provider Last Rate Last Admin   0.9 %  sodium chloride infusion  500 mL Intravenous Once Jonnelle Lawniczak,  Carlota Raspberry, MD        Allergies as of 03/20/2022   (No Known Allergies)    Family History  Problem Relation Age of Onset   Colon cancer Mother 62   Alzheimer's disease Mother    Heart disease Father    Cancer - Lung Father    Breast cancer Neg Hx    Esophageal cancer Neg Hx    Stomach cancer Neg Hx    Rectal cancer Neg Hx     Social History   Socioeconomic History   Marital status: Divorced    Spouse name: Not on file   Number of children: 3   Years of education: Not on file   Highest education level: Not on file  Occupational History   Not on file  Tobacco Use   Smoking status: Every Day    Packs/day: 1.00    Years: 49.00    Total pack years: 49.00    Types: Cigarettes   Smokeless tobacco: Not on file  Vaping Use   Vaping Use: Never used  Substance and Sexual Activity   Alcohol use: Yes    Alcohol/week: 2.0 standard drinks of alcohol    Types: 2 Cans of beer per week    Comment: occasionally   Drug use: No   Sexual activity: Not Currently  Other Topics Concern   Not on file  Social History Narrative   Not on file   Social Determinants of Health   Financial Resource Strain: Not on file  Food Insecurity: Not on file  Transportation Needs: Not on file  Physical Activity: Not on file  Stress: Not on file  Social Connections: Not on file  Intimate Partner Violence: Not on file    Review of Systems: All other review of systems negative except as mentioned in the HPI.  Physical Exam: Vital signs BP (!) 158/71   Pulse 88   Temp 97.8 F (36.6 C)   Ht '5\' 5"'$  (1.651 m)   Wt 128 lb (58.1 kg)   SpO2 97%   BMI 21.30 kg/m   General:   Alert,  Well-developed, pleasant and cooperative in NAD Lungs:  Clear throughout to auscultation.   Heart:  Regular rate and rhythm Abdomen:  Soft, nontender and nondistended.   Neuro/Psych:  Alert and cooperative. Normal mood and affect. A and O x 3  Jolly Mango, MD [facility]

## 2022-03-20 NOTE — Progress Notes (Signed)
Procedure went well. During the exam on the heart monitor the patient was noted to have some occasional PVCs. This has occurred in the recovery area intermittently although with less frequency in recovery. She is asymptomatic. No palpitations, chest pain, or shortness of breath. She reports being told she had PVCs in the past. Have discussed with anesthesia, recommend she see her PCP in follow up to discuss this further and determine if further evaluation is needed. Vitals otherwise stable. Spoke with patient and daughter who understand and agree.

## 2022-03-21 ENCOUNTER — Telehealth: Payer: Self-pay

## 2022-03-21 NOTE — Telephone Encounter (Signed)
  Follow up Call-     03/20/2022    8:10 AM  Call back number  Post procedure Call Back phone  # [phone]  Permission to leave phone message Yes     Patient questions:  Do you have a fever, pain , or abdominal swelling? No. Pain Score  0 *  Have you tolerated food without any problems? Yes.    Have you been able to return to your normal activities? Yes.    Do you have any questions about your discharge instructions: Diet   No. Medications  No. Follow up visit  No.  Do you have questions or concerns about your Care? No.  Actions: * If pain score is 4 or above: No action needed, pain <4.

## 2022-03-22 ENCOUNTER — Telehealth: Payer: Self-pay | Admitting: Registered Nurse

## 2022-03-22 NOTE — Telephone Encounter (Signed)
Caller name: Shekita   On DPR? :yes/no: Yes  Call back number: [phone]  Provider they see: Orland Mustard  Reason for call: pt calling b/c was supposed to have a biopsy at the [facility] 03/25/22, but they cancelled it b/c she was told that "sign off was needed"; she is wondering what she needs to do from here.  Pt also aware that she will need to establish with a new PCP.

## 2022-03-22 NOTE — Telephone Encounter (Signed)
Advised pt that she may discuss biopsy situation at her appoint ment 03/26/22.  Pt agreeable to this.

## 2022-03-26 ENCOUNTER — Ambulatory Visit (INDEPENDENT_AMBULATORY_CARE_PROVIDER_SITE_OTHER): Payer: Medicare HMO | Admitting: Family Medicine

## 2022-03-26 ENCOUNTER — Encounter: Payer: Self-pay | Admitting: Family Medicine

## 2022-03-26 VITALS — BP 122/80 | HR 81 | Temp 97.2°F | Resp 16 | Ht 65.0 in | Wt 128.5 lb

## 2022-03-26 DIAGNOSIS — E0500 Thyrotoxicosis with diffuse goiter without thyrotoxic crisis or storm: Secondary | ICD-10-CM

## 2022-03-26 DIAGNOSIS — R928 Other abnormal and inconclusive findings on diagnostic imaging of breast: Secondary | ICD-10-CM | POA: Diagnosis not present

## 2022-03-26 DIAGNOSIS — I493 Ventricular premature depolarization: Secondary | ICD-10-CM | POA: Diagnosis not present

## 2022-03-26 LAB — CBC WITH DIFFERENTIAL/PLATELET
Basophils Absolute: 0.1 10*3/uL (ref 0.0–0.1)
Basophils Relative: 0.6 % (ref 0.0–3.0)
Eosinophils Absolute: 0.2 10*3/uL (ref 0.0–0.7)
Eosinophils Relative: 1.8 % (ref 0.0–5.0)
HCT: 42.2 % (ref 36.0–46.0)
Hemoglobin: 14.3 g/dL (ref 12.0–15.0)
Lymphocytes Relative: 28.4 % (ref 12.0–46.0)
Lymphs Abs: 2.9 10*3/uL (ref 0.7–4.0)
MCHC: 34 g/dL (ref 30.0–36.0)
MCV: 90.7 fl (ref 78.0–100.0)
Monocytes Absolute: 0.7 10*3/uL (ref 0.1–1.0)
Monocytes Relative: 6.5 % (ref 3.0–12.0)
Neutro Abs: 6.5 10*3/uL (ref 1.4–7.7)
Neutrophils Relative %: 62.7 % (ref 43.0–77.0)
Platelets: 296 10*3/uL (ref 150.0–400.0)
RBC: 4.65 Mil/uL (ref 3.87–5.11)
RDW: 13.2 % (ref 11.5–15.5)
WBC: 10.3 10*3/uL (ref 4.0–10.5)

## 2022-03-26 NOTE — Progress Notes (Signed)
   Subjective:    Patient ID: Jennifer [name], female    DOB: [birth year], 66 y.o.   MRN: [id number]  HPI PVCs- pt had colonoscopy done on 10/4 and was noted to have PVCs during the procedure and while in recovery.  Pt was asymptomatic at the time.  Pt states she doesn't feel any palpitations but she has been struggling w/ fatigue.  Pt states she has had PVCs in the past but has not required medication.  Pt has hx of Graves Disease  Abnormal mammo- pt had indeterminate calcifications in both breasts.  She was told she needed a biopsy and this was scheduled for 10/9 but then canceled.  [facility] b/c they said they didn't have signed orders.     Review of Systems For ROS see HPI     Objective:   Physical Exam Vitals reviewed.  Constitutional:      General: She is not in acute distress.    Appearance: Normal appearance. She is not ill-appearing.  HENT:     Head: Normocephalic and atraumatic.  Cardiovascular:     Rate and Rhythm: Normal rate and regular rhythm.     Pulses: Normal pulses.     Heart sounds: No murmur heard. Pulmonary:     Effort: Pulmonary effort is normal. No respiratory distress.     Breath sounds: Normal breath sounds. No wheezing or rhonchi.  Skin:    General: Skin is warm and dry.  Neurological:     General: No focal deficit present.     Mental Status: She is alert and oriented to person, place, and time.  Psychiatric:        Mood and Affect: Mood normal.        Behavior: Behavior normal.        Thought Content: Thought content normal.           Assessment & Plan:   PVCs- new.  Pt reports she has a history of these but recently has been unaware/asymptomatic.  They were noted on the monitor when she had her colonoscopy.  Will check labs that could cause palpitations and refer to Cards for a complete evaluation.  Pt expressed understanding and is in agreement w/ plan.   Abnormal mammogram- new.  Pt had indeterminate  calcifications in both breasts and was scheduled for bx yesterday.  [facility] canceled [facility] stating they didn't have signed orders but they are clearly signed in the chart.  Will have referral coordinator reach out and try and get pt rescheduled ASAP.  Pt is appreciative.

## 2022-03-26 NOTE — Assessment & Plan Note (Signed)
New to provider, ongoing for pt.  Not currently on any medication.  Will check thyroid labs as this may be contributing to her PVCs.

## 2022-03-26 NOTE — Patient Instructions (Signed)
We'll notify you of your lab results and make any changes if needed We'll call you to schedule your cardiology appt We'll let you know what's going on with the mammo/biopsy Please schedule an appt with a new provider so there are no gaps in your care Call with any questions or concerns Hang in there!!

## 2022-03-27 LAB — BASIC METABOLIC PANEL
BUN: 14 mg/dL (ref 6–23)
CO2: 27 mEq/L (ref 19–32)
Calcium: 9.3 mg/dL (ref 8.4–10.5)
Chloride: 105 mEq/L (ref 96–112)
Creatinine, Ser: 0.61 mg/dL (ref 0.40–1.20)
GFR: 93.89 mL/min (ref >60.00)
Glucose, Bld: 82 mg/dL (ref 70–99)
Potassium: 4 mEq/L (ref 3.5–5.1)
Sodium: 141 mEq/L (ref 135–145)

## 2022-03-27 LAB — TSH: TSH: 0.07 u[IU]/mL — ABNORMAL LOW (ref 0.35–5.50)

## 2022-03-27 LAB — T3, FREE: T3, Free: 3.5 pg/mL (ref 2.3–4.2)

## 2022-03-27 LAB — T4, FREE: Free T4: 1.03 ng/dL (ref 0.60–1.60)

## 2022-03-27 NOTE — Progress Notes (Signed)
Left Vm for pt stating her lab results

## 2022-04-15 ENCOUNTER — Ambulatory Visit
Admission: RE | Admit: 2022-04-15 | Discharge: 2022-04-15 | Disposition: A | Discharge status: Home / Self Care | Payer: Medicare HMO | Source: Ambulatory Visit | Attending: Registered Nurse | Admitting: Registered Nurse

## 2022-04-15 DIAGNOSIS — R928 Other abnormal and inconclusive findings on diagnostic imaging of breast: Secondary | ICD-10-CM

## 2022-04-15 DIAGNOSIS — R921 Mammographic calcification found on diagnostic imaging of breast: Secondary | ICD-10-CM | POA: Diagnosis not present

## 2022-04-15 DIAGNOSIS — N6489 Other specified disorders of breast: Secondary | ICD-10-CM | POA: Diagnosis not present

## 2022-04-15 HISTORY — PX: BREAST BIOPSY: SHX20

## 2022-04-17 ENCOUNTER — Ambulatory Visit: Payer: Medicare HMO

## 2022-04-17 ENCOUNTER — Encounter: Payer: Self-pay | Admitting: Cardiovascular Disease

## 2022-04-17 ENCOUNTER — Ambulatory Visit: Payer: Medicare HMO | Attending: Cardiovascular Disease | Admitting: Cardiovascular Disease

## 2022-04-17 DIAGNOSIS — E785 Hyperlipidemia, unspecified: Secondary | ICD-10-CM | POA: Insufficient documentation

## 2022-04-17 DIAGNOSIS — I493 Ventricular premature depolarization: Secondary | ICD-10-CM

## 2022-04-17 DIAGNOSIS — Z720 Tobacco use: Secondary | ICD-10-CM

## 2022-04-17 DIAGNOSIS — Z8249 Family history of ischemic heart disease and other diseases of the circulatory system: Secondary | ICD-10-CM

## 2022-04-17 DIAGNOSIS — E782 Mixed hyperlipidemia: Secondary | ICD-10-CM

## 2022-04-17 NOTE — Assessment & Plan Note (Signed)
Father had MI at age 65

## 2022-04-17 NOTE — Progress Notes (Unsigned)
Enrolled patient for a 14 day Zio XT  monitor to be mailed to patients home  °

## 2022-04-17 NOTE — Progress Notes (Signed)
04/17/2022 Jennifer [name]   [birth year]  [id number]  Primary Physician Maximiano Coss, NP Primary Cardiologist: Lorretta Harp MD Renae Gloss  HPI:  Jennifer [name] is a 66 y.o. 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  She was referred by Dr. Birdie Riddle, her PCP, for evaluation of asymptomatic PVCs.  Her risk factor profile is notable for 50-pack-year tobacco abuse having smoked 1 pack a day for last 50 years recalcitrant to risk factor modification.  She does have untreated hyperlipidemia.  Her father had an MI at age 10.  She is never had a heart attack or stroke.  She denies chest pain or shortness of breath.  She does have Graves' disease.  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  Apparently at a recent colonoscopy anesthesia noted PVCs during the procedure.  She occasionally gets dizzy.   Current Meds  Medication Sig   alendronate (FOSAMAX) 70 MG tablet Take 1 tablet (70 mg total) by mouth every 7 (seven) days. Take with a full glass of water on an empty stomach.   naproxen sodium (ALEVE) 220 MG tablet Take 220 mg by mouth as needed.   OVER THE COUNTER MEDICATION Calcium one capsule daily.   OVER THE COUNTER MEDICATION Vitamin D 3 one capsule daily.     No Known Allergies  Social History   Socioeconomic History   Marital status: Divorced    Spouse name: Not on file   Number of children: 3   Years of education: Not on file   Highest education level: Not on file  Occupational History   Not on file  Tobacco Use   Smoking status: Every Day    Packs/day: 1.00    Years: 49.00    Total pack years: 49.00    Types: Cigarettes   Smokeless tobacco: Not on file  Vaping Use   Vaping Use: Never used  Substance and Sexual Activity   Alcohol use: Yes    Alcohol/week: 2.0 standard drinks of alcohol    Types: 2 Cans of beer per week    Comment:  occasionally   Drug use: No   Sexual activity: Not Currently  Other Topics Concern   Not on file  Social History Narrative   Not on file   Social Determinants of Health   Financial Resource Strain: Not on file  Food Insecurity: Not on file  Transportation Needs: Not on file  Physical Activity: Not on file  Stress: Not on file  Social Connections: Not on file  Intimate Partner Violence: Not on file     Review of Systems: General: negative for chills, fever, night sweats or weight changes.  Cardiovascular: negative for chest pain, dyspnea on exertion, edema, orthopnea, palpitations, paroxysmal nocturnal dyspnea or shortness of breath Dermatological: negative for rash Respiratory: negative for cough or wheezing Urologic: negative for hematuria Abdominal: negative for nausea, vomiting, diarrhea, bright red blood per rectum, melena, or hematemesis Neurologic: negative for visual changes, syncope, or dizziness All other systems reviewed and are otherwise negative except as noted above.    Blood pressure (!) 178/80, pulse 68, height '5\' 6"'$  (1.676 m), weight 126 lb 9.6 oz (57.4 kg), SpO2 97 %.  General appearance: alert and no distress Neck: no adenopathy, no JVD, supple, symmetrical, trachea midline, thyroid not enlarged, symmetric, no tenderness/mass/nodules, and soft bilateral carotid bruits Lungs: clear to auscultation bilaterally Heart: regular rate  and rhythm, S1, S2 normal, no murmur, click, rub or gallop Extremities: extremities normal, atraumatic, no cyanosis or edema Pulses: 2+ and symmetric Skin: Skin color, texture, turgor normal. No rashes or lesions Neurologic: Grossly normal  EKG sinus rhythm at 68 without ST or T wave changes.  Personally reviewed this EKG.  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  They occur randomly.  She occasionally gets dizzy.  She does have a history of Graves' disease.  I am going to get a 2D echo  and a 2-week Zio patch to further evaluate.  Tobacco abuse 50 pack years of tobacco use continues to smoke 1 pack/day recalcitrant to factor modification.  Hyperlipidemia History of hyperlipidemia not on statin therapy with lipid profile performed 01/02/2022 revealing total cholesterol 200, LDL 130 and HDL of 53.  I am going to get a coronary calcium score to help guide how aggressive to be with risk factor modification.  Family history of heart disease Father had MI at age 39     Lorretta Harp MD [place] 04/17/2022 9:47 AM

## 2022-04-17 NOTE — Assessment & Plan Note (Signed)
50 pack years of tobacco use continues to smoke 1 pack/day recalcitrant to factor modification.

## 2022-04-17 NOTE — Patient Instructions (Addendum)
Medication Instructions:  Your physician recommends that you continue on your current medications as directed. Please refer to the Current Medication list given to you today.  *If you need a refill on your cardiac medications before your next appointment, please call your pharmacy*   Testing/Procedures: Your physician has requested that you have an echocardiogram. Echocardiography is a painless test that uses sound waves to create images of your heart. It provides your doctor with information about the size and shape of your heart and how well your heart's chambers and valves are working. This procedure takes approximately one hour. There are no restrictions for this procedure. Please do NOT wear cologne, perfume, aftershave, or lotions (deodorant is allowed). Please arrive 15 minutes prior to your appointment time. This procedure will be done at [facility], 2nd Floor   Your physician has requested that you have a carotid duplex. This test is an ultrasound of the carotid arteries in your neck. It looks at blood flow through these arteries that supply the brain with blood. Allow one hour for this exam. There are no restrictions or special instructions. This procedure will be done at [place]. [address]  Dr. Gwenlyn Found has ordered a CT coronary calcium score.   Test locations:   [facility], 2nd Floor)  This is $99 out of pocket.   Coronary CalciumScan A coronary calcium scan is an imaging test used to look for deposits of calcium and other fatty materials (plaques) in the inner lining of the blood vessels of the heart (coronary arteries). These deposits of calcium and plaques can partly clog and narrow the coronary arteries without producing any symptoms or warning signs. This puts a person at risk for a heart attack. This test can detect these deposits before symptoms develop. Tell a health care provider about: Any allergies you have. All  medicines you are taking, including vitamins, herbs, eye drops, creams, and over-the-counter medicines. Any problems you or family members have had with anesthetic medicines. Any blood disorders you have. Any surgeries you have had. Any medical conditions you have. Whether you are pregnant or may be pregnant. What are the risks? Generally, this is a safe procedure. However, problems may occur, including: Harm to a pregnant woman and her unborn baby. This test involves the use of radiation. Radiation exposure can be dangerous to a pregnant woman and her unborn baby. If you are pregnant, you generally should not have this procedure done. Slight increase in the risk of cancer. This is because of the radiation involved in the test. What happens before the procedure? No preparation is needed for this procedure. What happens during the procedure? You will undress and remove any jewelry around your neck or chest. You will put on a hospital gown. Sticky electrodes will be placed on your chest. The electrodes will be connected to an electrocardiogram (ECG) machine to record a tracing of the electrical activity of your heart. A CT scanner will take pictures of your heart. During this time, you will be asked to lie still and hold your breath for 2-3 seconds while a picture of your heart is being taken. The procedure may vary among health care providers and hospitals. What happens after the procedure? You can get dressed. You can return to your normal activities. It is up to you to get the results of your test. Ask your health care provider, or the department that is doing the test, when your results will be ready. Summary A  coronary calcium scan is an imaging test used to look for deposits of calcium and other fatty materials (plaques) in the inner lining of the blood vessels of the heart (coronary arteries). Generally, this is a safe procedure. Tell your health care provider if you are pregnant or may  be pregnant. No preparation is needed for this procedure. A CT scanner will take pictures of your heart. You can return to your normal activities after the scan is done. This information is not intended to replace advice given to you by your health care provider. Make sure you discuss any questions you have with your health care provider. Document Released: 11/30/2007 Document Revised: 04/22/2016 Document Reviewed: 04/22/2016 Elsevier Interactive Patient Education  2017 [employer] Monitor Instructions  Your physician has requested you wear a ZIO patch monitor for 14 days.  This is a single patch monitor. [employer] supplies one patch monitor per enrollment. Additional stickers are not available. Please do not apply patch if you will be having a Nuclear Stress Test,  Echocardiogram, Cardiac CT, MRI, or Chest Xray during the period you would be wearing the  monitor. The patch cannot be worn during these tests. You cannot remove and re-apply the  ZIO XT patch monitor.  Your ZIO patch monitor will be mailed 3 day USPS to your address on file. It may take 3-5 days  to receive your monitor after you have been enrolled.  Once you have received your monitor, please review the enclosed instructions. Your monitor  has already been registered assigning a specific monitor serial # to you.  Billing and Patient Assistance Program Information  We have supplied [employer] with any of your insurance information on file for billing purposes. [employer] offers a sliding scale Patient Assistance Program for patients that do not have  insurance, or whose insurance does not completely cover the cost of the ZIO monitor.  You must apply for the Patient Assistance Program to qualify for this discounted rate.  To apply, please call [employer] at [phone], select option 4, select option 2, ask to apply for  Patient Assistance Program. Jennifer [name] will ask your household income, and how many people   are in your household. They will quote your out-of-pocket cost based on that information.  [employer] will also be able to set up a 29-month interest-free payment plan if needed.  Applying the monitor   Shave hair from upper left chest.  Hold abrader disc by orange tab. Rub abrader in 40 strokes over the upper left chest as  indicated in your monitor instructions.  Clean area with 4 enclosed alcohol pads. Let dry.  Apply patch as indicated in monitor instructions. Patch will be placed under collarbone on left  side of chest with arrow pointing upward.  Rub patch adhesive wings for 2 minutes. Remove white label marked "1". Remove the white  label marked "2". Rub patch adhesive wings for 2 additional minutes.  While looking in a mirror, press and release button in center of patch. A small green light will  flash 3-4 times. This will be your only indicator that the monitor has been turned on.  Do not shower for the first 24 hours. You may shower after the first 24 hours.  Press the button if you feel a symptom. You will hear a small click. Record Date, Time and  Symptom in the Patient Logbook.  When you are ready to remove the patch, follow instructions on the last 2 pages of  Patient  Logbook. Stick patch monitor onto the last page of Patient Logbook.  Place Patient Logbook in the blue and white box. Use locking tab on box and tape box closed  securely. The blue and white box has prepaid postage on it. Please place it in the mailbox as  soon as possible. Your physician should have your test results approximately 7 days after the  monitor has been mailed back to [facility].  Call [place] at [phone] if you have questions regarding  your ZIO XT patch monitor. Call them immediately if you see an orange light blinking on your  monitor.  If your monitor falls off in less than 4 days, contact our Monitor department at [phone].  If your monitor becomes loose or  falls off after 4 days call [employer] at [phone] for  suggestions on securing your monitor   Follow-Up: At [facility], you and your health needs are our priority.  As part of our continuing mission to provide you with exceptional heart care, we have created designated Provider Care Teams.  These Care Teams include your primary Cardiologist (physician) and Advanced Practice Providers (APPs -  Physician Assistants and Nurse Practitioners) who all work together to provide you with the care you need, when you need it.  We recommend signing up for the patient portal called "MyChart".  Sign up information is provided on this After Visit Summary.  MyChart is used to connect with patients for Virtual Visits (Telemedicine).  Patients are able to view lab/test results, encounter notes, upcoming appointments, etc.  Non-urgent messages can be sent to your provider as well.   To learn more about what you can do with MyChart, go to [email or web address].    Your next appointment:   7-8 week(s)  The format for your next appointment:   In Person  Provider:   Quay Burow, MD

## 2022-04-17 NOTE — Assessment & Plan Note (Signed)
History of hyperlipidemia not on statin therapy with lipid profile performed 01/02/2022 revealing total cholesterol 200, LDL 130 and HDL of 53.  I am going to get a coronary calcium score to help guide how aggressive to be with risk factor modification.

## 2022-04-17 NOTE — Assessment & Plan Note (Signed)
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  They occur randomly.  She occasionally gets dizzy.  She does have a history of Graves' disease.  I am going to get a 2D echo and a 2-week Zio patch to further evaluate.

## 2022-04-23 ENCOUNTER — Other Ambulatory Visit: Payer: Self-pay | Admitting: Cardiovascular Disease

## 2022-04-23 ENCOUNTER — Ambulatory Visit (HOSPITAL_COMMUNITY)
Admission: RE | Admit: 2022-04-23 | Discharge: 2022-04-23 | Disposition: A | Discharge status: Home / Self Care | Payer: Medicare HMO | Source: Ambulatory Visit | Attending: Cardiovascular Disease | Admitting: Cardiovascular Disease

## 2022-04-23 DIAGNOSIS — I6523 Occlusion and stenosis of bilateral carotid arteries: Secondary | ICD-10-CM | POA: Diagnosis not present

## 2022-04-23 DIAGNOSIS — Z720 Tobacco use: Secondary | ICD-10-CM | POA: Diagnosis not present

## 2022-04-23 DIAGNOSIS — Z8249 Family history of ischemic heart disease and other diseases of the circulatory system: Secondary | ICD-10-CM | POA: Insufficient documentation

## 2022-04-23 DIAGNOSIS — E782 Mixed hyperlipidemia: Secondary | ICD-10-CM | POA: Diagnosis not present

## 2022-04-23 DIAGNOSIS — I493 Ventricular premature depolarization: Secondary | ICD-10-CM

## 2022-04-25 ENCOUNTER — Other Ambulatory Visit: Payer: Self-pay

## 2022-04-25 DIAGNOSIS — I6523 Occlusion and stenosis of bilateral carotid arteries: Secondary | ICD-10-CM

## 2022-05-02 ENCOUNTER — Ambulatory Visit (INDEPENDENT_AMBULATORY_CARE_PROVIDER_SITE_OTHER): Payer: Medicare HMO

## 2022-05-02 DIAGNOSIS — Z720 Tobacco use: Secondary | ICD-10-CM | POA: Diagnosis not present

## 2022-05-02 DIAGNOSIS — I493 Ventricular premature depolarization: Secondary | ICD-10-CM

## 2022-05-02 DIAGNOSIS — I358 Other nonrheumatic aortic valve disorders: Secondary | ICD-10-CM | POA: Diagnosis not present

## 2022-05-02 DIAGNOSIS — Z8249 Family history of ischemic heart disease and other diseases of the circulatory system: Secondary | ICD-10-CM

## 2022-05-02 DIAGNOSIS — E782 Mixed hyperlipidemia: Secondary | ICD-10-CM | POA: Diagnosis not present

## 2022-05-02 LAB — ECHOCARDIOGRAM COMPLETE
Area-P 1/2: 4.49 cm2
S' Lateral: 2.66 cm

## 2022-06-05 ENCOUNTER — Other Ambulatory Visit (HOSPITAL_BASED_OUTPATIENT_CLINIC_OR_DEPARTMENT_OTHER): Payer: Medicare HMO

## 2022-06-12 ENCOUNTER — Ambulatory Visit: Payer: Medicare HMO | Admitting: Cardiovascular Disease

## 2022-06-16 DIAGNOSIS — Z0100 Encounter for examination of eyes and vision without abnormal findings: Secondary | ICD-10-CM | POA: Diagnosis not present

## 2022-06-21 ENCOUNTER — Encounter (HOSPITAL_BASED_OUTPATIENT_CLINIC_OR_DEPARTMENT_OTHER): Payer: Self-pay | Admitting: Family Medicine

## 2022-06-21 ENCOUNTER — Ambulatory Visit (INDEPENDENT_AMBULATORY_CARE_PROVIDER_SITE_OTHER): Payer: Medicare HMO | Admitting: Family Medicine

## 2022-06-21 DIAGNOSIS — E0500 Thyrotoxicosis with diffuse goiter without thyrotoxic crisis or storm: Secondary | ICD-10-CM | POA: Diagnosis not present

## 2022-06-21 DIAGNOSIS — M810 Age-related osteoporosis without current pathological fracture: Secondary | ICD-10-CM | POA: Insufficient documentation

## 2022-06-21 DIAGNOSIS — R0602 Shortness of breath: Secondary | ICD-10-CM

## 2022-06-21 DIAGNOSIS — Z720 Tobacco use: Secondary | ICD-10-CM | POA: Diagnosis not present

## 2022-06-21 NOTE — Progress Notes (Signed)
New Patient Office Visit  Subjective    Patient ID: Jennifer [name], female    DOB: 05/29/1956  Age: 67 y.o. MRN: [id number]  CC:  Chief Complaint  Patient presents with   New Patient (Initial Visit)    Pt here to establish new care, pt would like to see if she can get a inhaler prescribed if possible     HPI Janis Dirocco presents to establish care Last PCP - Maximiano Coss  She has had recent evaluation with cardiology related to asymptomatic PVCs.  These were noted during monitoring when she had colonoscopy recently.  Cardiology completed echocardiogram and 2-week heart monitor. Echocardiogram findings were reassuring.  She continues to deny any symptoms related to potential palpitations.  Chart indicates history of Graves disease: She reports this being diagnosed 13 years ago. She did not have any specific treatment at that time.  She has had recent laboratory testing completed which continues to show notably low TSH, but with normal free T4 measurements.  Outside of reported fatigue which has been evaluated with most recent PCP and asymptomatic PVCs as above, patient remains without any symptoms.  She did have recent bone density testing completed which showed presence of osteoporosis.  Osteoporosis: Observed on recent bone mineral density screening.  She was started on alendronate, she does continue with this, denies any side effects with medication.  She additionally ports that she has been taking over-the-counter calcium and vitamin D supplements  She does smoke cigarettes, about 1 ppd.  Recommendation for lung cancer screening has been discussed with patient.  On review of chart, it does appear that referral to local pulmonology office has been placed.  She indicates that she has not yet arranged appointment, primarily citing cost given recent evaluation and testing with cardiology among other labs and imaging performed recently.  She does have some reported symptoms of  occasional cough, shortness of breath.  Patient is originally from [place]. 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 Outside of work, she enjoys playing tennis, walking dog, spending time with daughter.  Outpatient Encounter Medications as of 06/21/2022  Medication Sig   alendronate (FOSAMAX) 70 MG tablet Take 1 tablet (70 mg total) by mouth every 7 (seven) days. Take with a full glass of water on an empty stomach.   naproxen sodium (ALEVE) 220 MG tablet Take 220 mg by mouth as needed.   OVER THE COUNTER MEDICATION Calcium one capsule daily.   OVER THE COUNTER MEDICATION Vitamin D 3 one capsule daily.   No facility-administered encounter medications on file as of 06/21/2022.    Past Medical History:  Diagnosis Date   Graves disease    Graves disease    Osteoporosis    Thyroid disease    graves    History reviewed. No pertinent surgical history.  Family History  Problem Relation Age of Onset   Colon cancer Mother 3   Alzheimer's disease Mother    Heart disease Father    Cancer - Lung Father    Breast cancer Neg Hx    Esophageal cancer Neg Hx    Stomach cancer Neg Hx    Rectal cancer Neg Hx     Social History   Socioeconomic History   Marital status: Divorced    Spouse name: Not on file   Number of children: 3   Years of education: Not on file   Highest education level: Not on file  Occupational History   Not on file  Tobacco Use  Smoking status: Every Day    Packs/day: 1.00    Years: 49.00    Total pack years: 49.00    Types: Cigarettes   Smokeless tobacco: Not on file  Vaping Use   Vaping Use: Never used  Substance and Sexual Activity   Alcohol use: Yes    Alcohol/week: 2.0 standard drinks of alcohol    Types: 2 Cans of beer per week    Comment: occasionally   Drug use: No   Sexual activity: Not Currently  Other Topics Concern   Not on file  Social History Narrative   Not on file   Social Determinants of Health   Financial Resource  Strain: Not on file  Food Insecurity: Not on file  Transportation Needs: Not on file  Physical Activity: Not on file  Stress: Not on file  Social Connections: Not on file  Intimate Partner Violence: Not on file    Objective    BP (!) 153/64 (BP Location: Left Arm, Patient Position: Sitting, Cuff Size: Normal)   Pulse 66   Temp 97.7 F (36.5 C) (Oral)   Ht '5\' 6"'$  (1.676 m)   Wt 129 lb 6.4 oz (58.7 kg)   SpO2 99%   BMI 20.89 kg/m   Physical Exam  67 year old female in no acute distress Cardiovascular exam with regular rate and rhythm Lungs with distant breath sounds, occasional expiratory wheezes  Assessment & Plan:   Problem List Items Addressed This Visit       Endocrine   Graves disease    Reported by history, unfortunately do not have documentation from time of initial diagnosis.  She has had persistently low TSH but typically with normal free T4.  Discussed that given findings of osteoporosis, treatment may be indicated in order to address this as potential risk factor related to osteoporosis.  As a result, recommend proceeding with further laboratory testing to verify diagnosis of Graves' disease.  She will plan to return for labs as she indicates that she would not be able to afford cost of further testing at this time      Relevant Orders   Thyrotropin receptor autoabs     Musculoskeletal and Integument   Osteoporosis    Diagnosed recently, was started on alendronate, continues with this medication, denies any issues at this time.  She also continues with vitamin D and calcium supplementation, encouraged to continue with this at this time As above, given underlying thyroid disease, recommend further evaluation as treatment may be warranted at this time given new diagnosis of osteoporosis as well as patient age        Other   Tobacco abuse    Extensive history of tobacco use, 50-pack-year history reported.  She is due for lung cancer screening.  She has been  previously referred to pulmonology office for further evaluation COPD.  She would prefer to be referred to pulmonology office here at [facility], new referral placed today.  Discussed importance of evaluation with pulmonology for further testing and diagnosis of any underlying lung disease in order to proceed with appropriate treatment and management to reduce symptoms and potential flareups in the future      Relevant Orders   Ambulatory referral to Pulmonology   Other Visit Diagnoses     Shortness of breath       Relevant Orders   Ambulatory referral to Pulmonology       Return in about 6 weeks (around 08/02/2022).   Jeb Schloemer J De [place], MD

## 2022-06-21 NOTE — Patient Instructions (Signed)
  Medication Instructions:  Your physician recommends that you continue on your current medications as directed. Please refer to the Current Medication list given to you today. --If you need a refill on any your medications before your next appointment, please call your pharmacy first. If no refills are authorized on file call the office.-- Lab Work: Your physician has recommended that you have lab work today: No If you have labs (blood work) drawn today and your tests are completely normal, you will receive your results via [place] a phone call from our staff.  Please ensure you check your voicemail in the event that you authorized detailed messages to be left on a delegated number. If you have any lab test that is abnormal or we need to change your treatment, we will call you to review the results.  Referrals/Procedures/Imaging: Yes  Follow-Up: Your next appointment:   Your physician recommends that you schedule a follow-up appointment in: 6-8 weeks with Dr. de [place].  You will receive a text message or e-mail with a link to a survey about your care and experience with [place] today! We would greatly appreciate your feedback!   Thanks for letting us be apart of your health journey!!  Primary Care and Sports Medicine   Dr. Arlina Robes [place]   We encourage you to activate your patient portal called "MyChart".  Sign up information is provided on this After Visit Summary.  MyChart is used to connect with patients for Virtual Visits (Telemedicine).  Patients are able to view lab/test results, encounter notes, upcoming appointments, etc.  Non-urgent messages can be sent to your provider as well. To learn more about what you can do with MyChart, please visit --  [email or web address].

## 2022-06-21 NOTE — Assessment & Plan Note (Signed)
Diagnosed recently, was started on alendronate, continues with this medication, denies any issues at this time.  She also continues with vitamin D and calcium supplementation, encouraged to continue with this at this time As above, given underlying thyroid disease, recommend further evaluation as treatment may be warranted at this time given new diagnosis of osteoporosis as well as patient age

## 2022-06-21 NOTE — Assessment & Plan Note (Signed)
Extensive history of tobacco use, 50-pack-year history reported.  She is due for lung cancer screening.  She has been previously referred to pulmonology office for further evaluation COPD.  She would prefer to be referred to pulmonology office here at [facility], new referral placed today.  Discussed importance of evaluation with pulmonology for further testing and diagnosis of any underlying lung disease in order to proceed with appropriate treatment and management to reduce symptoms and potential flareups in the future

## 2022-06-21 NOTE — Assessment & Plan Note (Signed)
Reported by history, unfortunately do not have documentation from time of initial diagnosis.  She has had persistently low TSH but typically with normal free T4.  Discussed that given findings of osteoporosis, treatment may be indicated in order to address this as potential risk factor related to osteoporosis.  As a result, recommend proceeding with further laboratory testing to verify diagnosis of Graves' disease.  She will plan to return for labs as she indicates that she would not be able to afford cost of further testing at this time

## 2022-07-17 ENCOUNTER — Encounter (HOSPITAL_BASED_OUTPATIENT_CLINIC_OR_DEPARTMENT_OTHER): Payer: Self-pay | Admitting: Pulmonary Disease

## 2022-07-17 ENCOUNTER — Ambulatory Visit (INDEPENDENT_AMBULATORY_CARE_PROVIDER_SITE_OTHER): Payer: Medicare HMO | Admitting: Pulmonary Disease

## 2022-07-17 VITALS — BP 122/70 | HR 79 | Ht 66.0 in | Wt 128.2 lb

## 2022-07-17 DIAGNOSIS — J42 Unspecified chronic bronchitis: Secondary | ICD-10-CM | POA: Diagnosis not present

## 2022-07-17 DIAGNOSIS — Z720 Tobacco use: Secondary | ICD-10-CM | POA: Diagnosis not present

## 2022-07-17 MED ORDER — SPIRIVA RESPIMAT 2.5 MCG/ACT IN AERS: 2.0000 | INHALATION_SPRAY | Freq: Every day | RESPIRATORY_TRACT | 5 refills | Status: DC

## 2022-07-17 MED ORDER — BUPROPION HCL ER (SR) 150 MG PO TB12: 150.0000 mg | ORAL_TABLET | Freq: Two times a day (BID) | ORAL | 4 refills | Status: DC

## 2022-07-17 NOTE — Patient Instructions (Signed)
Chronic bronchitis Suspected COPD --ORDER pulmonary function tests --START Spiriva TWO puffs in the morning --Encourage regular aerobic activity daily  Tobacco abuse Patient is an active smoker. Interested in quitting We discussed smoking cessation for 10 minutes. We discussed triggers and stressors and ways to deal with them. We discussed barriers to continued smoking and benefits of smoking cessation. Provided patient with information cessation techniques and interventions including Stella quitline. --Wellbutrin ordered  Follow-up with me in April or May with PFTs prior to visit

## 2022-07-17 NOTE — Progress Notes (Signed)
Subjective:   PATIENT ID: Jennifer [name] GENDER: female DOB: 02-Nov-1955, MRN: [id number]  Chief Complaint  Patient presents with   Consult    sob    Reason for Visit: New consult for shortness of breath  [name] is a 67 year old female active smoker with hx Grave's disease and osteoporosis who presents for evaluation for shortness of breath.  She recently established care with Dr. [place] and had prior referral to Pulmonary. She has shortness of breath and occasional nonproductive cough and chest congestion that usually worsens in the summer due to changes in humidity when going from inside to outside.  Denies wheezing. Has smoked since she was 16 years ago. Currently smoking 1 ppd. Has previously tried Wellbutrin. Did not tolerate Chantix in the past.  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, walks dog  I have personally reviewed patient's past medical/family/social history, allergies, current medications  Past Medical History:  Diagnosis Date   Graves disease    Graves disease    Osteoporosis    Thyroid disease    graves     Family History  Problem Relation Age of Onset   Colon cancer Mother 41   Alzheimer's disease Mother    Heart disease Father    Cancer - Lung Father    Breast cancer Neg Hx    Esophageal cancer Neg Hx    Stomach cancer Neg Hx    Rectal cancer Neg Hx      Social History   Occupational History   Not on file  Tobacco Use   Smoking status: Every Day    Packs/day: 1.00    Years: 49.00    Total pack years: 49.00    Types: Cigarettes   Smokeless tobacco: Not on file   Tobacco comments:    Started smoking at 44  Vaping Use   Vaping Use: Never used  Substance and Sexual Activity   Alcohol use: Yes    Alcohol/week: 2.0 standard drinks of alcohol    Types: 2 Cans of beer per week    Comment: occasionally   Drug use: No   Sexual activity: Not Currently    No Known Allergies   Outpatient  Medications Prior to Visit  Medication Sig Dispense Refill   alendronate (FOSAMAX) 70 MG tablet Take 1 tablet (70 mg total) by mouth every 7 (seven) days. Take with a full glass of water on an empty stomach. 12 tablet 3   naproxen sodium (ALEVE) 220 MG tablet Take 220 mg by mouth as needed.     OVER THE COUNTER MEDICATION Calcium one capsule daily.     OVER THE COUNTER MEDICATION Vitamin D 3 one capsule daily.     No facility-administered medications prior to visit.    Review of Systems  Constitutional:  Negative for chills, diaphoresis, fever, malaise/fatigue and weight loss.  HENT:  Positive for congestion.   Respiratory:  Positive for cough and shortness of breath. Negative for hemoptysis, sputum production and wheezing.   Cardiovascular:  Negative for chest pain, palpitations and leg swelling.     Objective:   Vitals:   07/17/22 0855  BP: 122/70  Pulse: 79  SpO2: 99%  Weight: 128 lb 3.2 oz (58.2 kg)  Height: '5\' 6"'$  (1.676 m)   SpO2: 99 % O2 Device: None (Room air)  Physical Exam: General: Well-appearing, no acute distress HENT: [place], AT Eyes: EOMI, no scleral icterus Respiratory: Diminished breath sounds bilaterally Cardiovascular: RRR, -  M/R/G, no JVD Extremities:-Edema,-tenderness Neuro: AAO x4, CNII-XII grossly intact Psych: Normal mood, normal affect  Data Reviewed:  Imaging: CXR 11/26/12 - Hyperinflation  PFT: None on file  Labs: CBC    Component Value Date/Time   WBC 10.3 03/26/2022 1129   RBC 4.65 03/26/2022 1129   HGB 14.3 03/26/2022 1129   HCT 42.2 03/26/2022 1129   PLT 296.0 03/26/2022 1129   MCV 90.7 03/26/2022 1129   MCH 31.0 11/26/2012 0916   MCHC 34.0 03/26/2022 1129   RDW 13.2 03/26/2022 1129   LYMPHSABS 2.9 03/26/2022 1129   MONOABS 0.7 03/26/2022 1129   EOSABS 0.2 03/26/2022 1129   BASOSABS 0.1 03/26/2022 1129   Absolute 03/26/22 - 200     Assessment & Plan:   Discussion: 67 year old female active smoker with hx Grave's disease  and osteoporosis who presents for evaluation for shortness of breath. No limitation in activity however noted breathlessness with exertion. Suspect COPD. Discussed risks and benefits of bronchodilators. Spent >50% visit in smoking cessation.  Chronic bronchitis Suspected COPD --ORDER pulmonary function tests --START Spiriva TWO puffs in the morning --Encourage regular aerobic activity daily  Tobacco abuse Patient is an active smoker. Interested in quitting We discussed smoking cessation for >10 minutes. We discussed triggers and stressors and ways to deal with them. We discussed barriers to continued smoking and benefits of smoking cessation. Provided patient with information cessation techniques and interventions including [place] quitline. --Wellbutrin ordered  Health Maintenance  There is no immunization history on file for this patient. CT Lung Screen - enrollment order placed  Orders Placed This Encounter  Procedures   Ambulatory Referral for Lung Cancer Scre    Referral Priority:   Routine    Referral Type:   Consultation    Referral Reason:   Specialty Services Required    Number of Visits Requested:   1   Pulmonary function test    Standing Status:   Future    Standing Expiration Date:   07/18/2023    Order Specific Question:   Where should this test be performed?    Answer:   [facility]    Order Specific Question:   Full PFT: includes the following: basic spirometry, spirometry pre & post bronchodilator, diffusion capacity (DLCO), lung volumes    Answer:   Full PFT   Meds ordered this encounter  Medications   Tiotropium Bromide Monohydrate (SPIRIVA RESPIMAT) 2.5 MCG/ACT AERS    Sig: Inhale 2 puffs into the lungs daily.    Dispense:  4 g    Refill:  5   buPROPion (WELLBUTRIN SR) 150 MG 12 hr tablet    Sig: Take 1 tablet (150 mg total) by mouth 2 (two) times daily.    Dispense:  60 tablet    Refill:  4    No follow-ups on file. Follow-up after PFTs and evaluate  inhaler response  I have spent a total time of 45-minutes on the day of the appointment reviewing prior documentation, coordinating care and discussing medical diagnosis and plan with the patient/family. Imaging, labs and tests included in this note have been reviewed and interpreted independently by me.  [place], MD [facility] 07/17/2022 9:37 AM  Office Number [phone]

## 2022-07-19 ENCOUNTER — Encounter (HOSPITAL_BASED_OUTPATIENT_CLINIC_OR_DEPARTMENT_OTHER): Payer: Self-pay

## 2022-07-24 ENCOUNTER — Encounter (HOSPITAL_BASED_OUTPATIENT_CLINIC_OR_DEPARTMENT_OTHER): Payer: Self-pay

## 2022-07-29 ENCOUNTER — Other Ambulatory Visit (HOSPITAL_COMMUNITY): Payer: Self-pay

## 2022-08-02 ENCOUNTER — Ambulatory Visit (HOSPITAL_BASED_OUTPATIENT_CLINIC_OR_DEPARTMENT_OTHER): Payer: Medicare HMO | Admitting: Family Medicine

## 2022-08-15 ENCOUNTER — Encounter: Payer: Self-pay | Admitting: Family Medicine

## 2022-08-15 ENCOUNTER — Ambulatory Visit (INDEPENDENT_AMBULATORY_CARE_PROVIDER_SITE_OTHER): Payer: Medicare HMO | Admitting: Family Medicine

## 2022-08-15 VITALS — BP 118/76 | HR 80 | Temp 98.1°F | Resp 16 | Ht 66.0 in | Wt 131.4 lb

## 2022-08-15 DIAGNOSIS — M810 Age-related osteoporosis without current pathological fracture: Secondary | ICD-10-CM

## 2022-08-15 DIAGNOSIS — Z7689 Persons encountering health services in other specified circumstances: Secondary | ICD-10-CM

## 2022-08-15 DIAGNOSIS — E0500 Thyrotoxicosis with diffuse goiter without thyrotoxic crisis or storm: Secondary | ICD-10-CM

## 2022-08-15 NOTE — Assessment & Plan Note (Signed)
Continue with Fosamax, calcium, and vitamin D. Last bone density scan was in August 2023.

## 2022-08-15 NOTE — Assessment & Plan Note (Signed)
Last TSH was low with normal free T3 and T4 in October 2023. Will reassess thyroid labs at next chronic management visit in the summer. She prefers to not do labs at this visit due to possible financial strain. She reports she is trying to get enough money to cover the cost of CT cardiac scan with cardiology. Denies any symptoms of Graves Disease.

## 2022-08-15 NOTE — Patient Instructions (Signed)
It was a pleasure to meet you this morning. I look forward to taking care of you! -Follow up in 1 month for your first AWV (Annual Wellness Visit) -Recommend to go to your local pharmacy to get your all vaccines. (Pneumonia, shingles, RSV, and tetanus)

## 2022-08-15 NOTE — Progress Notes (Signed)
New Patient Office Visit  Subjective    Patient ID: Jennifer [name], female    DOB: 1955-12-26  Age: 67 y.o. MRN: [id number]  CC:  Chief Complaint  Patient presents with   Establish Care     Est care  Doing well per pt     HPI Jennifer [name] presents to establish care with new provider.  Patient reports she was diagnosed 13 years ago with Graves Disease. She reports she was seen by an oncologist, but didn't have further treatment. Denies seeing an endocrinologist or rheumatologist. Denies taking any medication. Her last TSH was 0.07 with normal free T3 and T4 in October 2023.  Patient is under the care of [facility] with Dr. Loanne Drilling. She has started taking Wellbutrin to help with smoking cessation. Also, awaiting a LDCT lung screening. She uses Spiriva inhaler.   Patient is under the care of [place] at [facility] with Dr. Quay Burow for asymptomatic PVCs. She has had an echocardiogram and heart monitor that were reassuring. She is awaiting a CT cardiac scan, but limited with financial responsibility of the scan. She reports she is trying to get enough money for scan.    Osteoporosis: She was started on Fosamax '70mg'$  weekly for osteoporosis due to last bone density scan being in August 2023. She is taking calcium and vitamin D.   Denies any concerns.  Outpatient Encounter Medications as of 08/15/2022  Medication Sig   alendronate (FOSAMAX) 70 MG tablet Take 1 tablet (70 mg total) by mouth every 7 (seven) days. Take with a full glass of water on an empty stomach.   buPROPion (WELLBUTRIN SR) 150 MG 12 hr tablet Take 1 tablet (150 mg total) by mouth 2 (two) times daily.   naproxen sodium (ALEVE) 220 MG tablet Take 220 mg by mouth as needed.   Tiotropium Bromide Monohydrate (SPIRIVA RESPIMAT) 2.5 MCG/ACT AERS Inhale 2 puffs into the lungs daily.   OVER THE COUNTER MEDICATION Calcium one capsule daily. (Patient not taking: Reported on 08/15/2022)   OVER THE  COUNTER MEDICATION Vitamin D 3 one capsule daily. (Patient not taking: Reported on 08/15/2022)   No facility-administered encounter medications on file as of 08/15/2022.    Past Medical History:  Diagnosis Date   Graves disease    Graves disease    Osteoporosis    Thyroid disease    graves    Past Surgical History:  Procedure Laterality Date   colonscopy  01/2022    Family History  Problem Relation Age of Onset   Colon cancer Mother 84   Alzheimer's disease Mother    Heart disease Father    Cancer - Lung Father    Diabetes Brother    Breast cancer Neg Hx    Esophageal cancer Neg Hx    Stomach cancer Neg Hx    Rectal cancer Neg Hx     Social History   Socioeconomic History   Marital status: Divorced    Spouse name: Not on file   Number of children: 3   Years of education: Not on file   Highest education level: High school graduate  Occupational History   Not on file  Tobacco Use   Smoking status: Every Day    Packs/day: 1.00    Years: 49.00    Total pack years: 49.00    Types: Cigarettes   Smokeless tobacco: Not on file   Tobacco comments:    Started smoking at 68; she is taking Wellbutrin by pulmonary to  stop smoking.   Vaping Use   Vaping Use: Never used  Substance and Sexual Activity   Alcohol use: Not Currently   Drug use: No   Sexual activity: Not Currently  Other Topics Concern   Not on file  Social History Narrative   Lives on the bottom floor of a house with her son/daughter in law living above; 6 grand children. 1 dog and 1 cat.    Social Determinants of Health   Financial Resource Strain: Not on file  Food Insecurity: Not on file  Transportation Needs: Not on file  Physical Activity: Not on file  Stress: Not on file  Social Connections: Not on file  Intimate Partner Violence: Not on file    ROS See HPI above    Objective    BP 118/76   Pulse 80   Temp 98.1 F (36.7 C) (Temporal)   Resp 16   Ht '5\' 6"'$  (1.676 m)   Wt 131 lb 6 oz  (59.6 kg)   SpO2 100%   BMI 21.20 kg/m   Physical Exam Vitals reviewed.  Constitutional:      General: She is not in acute distress.    Appearance: Normal appearance. She is normal weight. She is not ill-appearing or toxic-appearing.  HENT:     Head: Normocephalic and atraumatic.  Neck:     Thyroid: No thyromegaly.  Cardiovascular:     Rate and Rhythm: Normal rate and regular rhythm.     Heart sounds: Normal heart sounds. No murmur heard.    No friction rub. No gallop.  Pulmonary:     Effort: Pulmonary effort is normal. No respiratory distress.     Breath sounds: Normal breath sounds.  Musculoskeletal:        General: Normal range of motion.     Right lower leg: No edema.     Left lower leg: No edema.  Skin:    General: Skin is warm and dry.  Neurological:     General: No focal deficit present.     Mental Status: She is alert and oriented to person, place, and time. Mental status is at baseline.  Psychiatric:        Mood and Affect: Mood normal.        Behavior: Behavior normal.        Thought Content: Thought content normal.        Judgment: Judgment normal.      Assessment & Plan:  1.Reviewed health maintenance. Encouraged patient to get her vaccines (pneumonia, shingles, RSV, and tetanus) at her local pharmacy due to cost of vaccines. Declines pap smear, request this be completed at another visit.  2. Continue with all medications. No change in medications.  Graves disease Assessment & Plan: Last TSH was low with normal free T3 and T4 in October 2023. Will reassess thyroid labs at next chronic management visit in the summer. She prefers to not do labs at this visit due to possible financial strain. She reports she is trying to get enough money to cover the cost of CT cardiac scan with cardiology. Denies any symptoms of Graves Disease.    Osteoporosis without current pathological fracture, unspecified osteoporosis type Assessment & Plan: Continue with Fosamax,  calcium, and vitamin D. Last bone density scan was in August 2023.    Encounter to establish care with new doctor    Return in about 1 month (around 09/13/2022) for AWV-first one .   Valarie Merino, NP

## 2022-09-16 ENCOUNTER — Ambulatory Visit (INDEPENDENT_AMBULATORY_CARE_PROVIDER_SITE_OTHER): Payer: Medicare HMO | Admitting: Family Medicine

## 2022-09-16 ENCOUNTER — Encounter: Payer: Self-pay | Admitting: Family Medicine

## 2022-09-16 ENCOUNTER — Telehealth: Payer: Self-pay | Admitting: Family Medicine

## 2022-09-16 VITALS — BP 132/62 | HR 98 | Temp 98.7°F | Ht 66.0 in | Wt 130.1 lb

## 2022-09-16 DIAGNOSIS — E0500 Thyrotoxicosis with diffuse goiter without thyrotoxic crisis or storm: Secondary | ICD-10-CM

## 2022-09-16 DIAGNOSIS — E782 Mixed hyperlipidemia: Secondary | ICD-10-CM | POA: Diagnosis not present

## 2022-09-16 DIAGNOSIS — R928 Other abnormal and inconclusive findings on diagnostic imaging of breast: Secondary | ICD-10-CM | POA: Diagnosis not present

## 2022-09-16 DIAGNOSIS — R7303 Prediabetes: Secondary | ICD-10-CM

## 2022-09-16 DIAGNOSIS — Z0000 Encounter for general adult medical examination without abnormal findings: Secondary | ICD-10-CM

## 2022-09-16 LAB — T4, FREE: Free T4: 0.86 ng/dL (ref 0.60–1.60)

## 2022-09-16 LAB — HEMOGLOBIN A1C: Hgb A1c MFr Bld: 5.6 % (ref 4.6–6.5)

## 2022-09-16 LAB — LIPID PANEL
Cholesterol: 221 mg/dL — ABNORMAL HIGH (ref 0–200)
HDL: 54.4 mg/dL (ref >39.00)
LDL Cholesterol: 150 mg/dL — ABNORMAL HIGH (ref 0–99)
NonHDL: 166.12
Total CHOL/HDL Ratio: 4
Triglycerides: 83 mg/dL (ref 0.0–149.0)
VLDL: 16.6 mg/dL (ref 0.0–40.0)

## 2022-09-16 LAB — TSH: TSH: 1.27 u[IU]/mL (ref 0.35–5.50)

## 2022-09-16 LAB — T3, FREE: T3, Free: 3.2 pg/mL (ref 2.3–4.2)

## 2022-09-16 NOTE — Patient Instructions (Signed)
-  Ordered diagnostic mammogram. If you do not get a call for an appointment 2 weeks, call back to the office. -If pulmonary does not schedule for a CT lung screening. Call back to the office for an order. -Ordered screening labs. Office will call with lab results and you should see them on MyChart.  -Follow up in 1 year for physical or sooner for any other complaints.

## 2022-09-16 NOTE — Progress Notes (Signed)
Welcome to Medicare Visit  Patient: Jennifer [name], Female    DOB: 1956-05-31, 67 y.o.   MRN: [id number]  Subjective  Chief Complaint  Patient presents with   Medicare Wellness    Pt is here today for AWV.    Jennifer [name] is a 67 y.o. female who presents today for her Welcome to Medicare Visit.   She reports consuming a general diet. Home exercise routine includes walking 3 hrs per day. Patient reports she walks her dog several times a day to equal 3 hours. She generally feels fairly well. She reports sleeping well. She does not have additional problems to discuss today.   HPI  Vision:Within last year and Dental: No current dental problems and No regular dental care  She reports having eye exams with [facility] with Dr. Fonnie Mu.    Patient Active Problem List   Diagnosis Date Noted   Chronic bronchitis 07/17/2022   Osteoporosis 06/21/2022   Asymptomatic PVCs 04/17/2022   Tobacco abuse 04/17/2022   Hyperlipidemia 04/17/2022   Family history of heart disease 04/17/2022   Graves disease    Medications: Outpatient Medications Prior to Visit  Medication Sig   alendronate (FOSAMAX) 70 MG tablet Take 1 tablet (70 mg total) by mouth every 7 (seven) days. Take with a full glass of water on an empty stomach.   buPROPion (WELLBUTRIN SR) 150 MG 12 hr tablet Take 1 tablet (150 mg total) by mouth 2 (two) times daily.   naproxen sodium (ALEVE) 220 MG tablet Take 220 mg by mouth as needed.   Tiotropium Bromide Monohydrate (SPIRIVA RESPIMAT) 2.5 MCG/ACT AERS Inhale 2 puffs into the lungs daily.   OVER THE COUNTER MEDICATION Calcium one capsule daily. (Patient not taking: Reported on 08/15/2022)   OVER THE COUNTER MEDICATION Vitamin D 3 one capsule daily. (Patient not taking: Reported on 08/15/2022)   No facility-administered medications prior to visit.    No Known Allergies  Patient Care Team: Evangeline Gula, NP as PCP - General (Family Medicine) Lorretta Harp, MD as Consulting Physician (Cardiology) Margaretha Seeds, MD as Consulting Physician (Pulmonary Disease) Fonnie Mu, OD (Optometry)  Review of Systems  Constitutional:  Negative for chills, fever, malaise/fatigue and weight loss.  Eyes:  Negative for blurred vision and double vision.  Respiratory:  Positive for cough (Chronic). Negative for hemoptysis and shortness of breath.   Cardiovascular:  Negative for chest pain, palpitations and leg swelling.  Gastrointestinal:  Negative for abdominal pain, blood in stool, constipation, diarrhea, nausea and vomiting.  Neurological:  Positive for headaches (In-frequent). Negative for dizziness, sensory change, speech change and weakness.     Objective  BP 132/62   Pulse 98   Temp 98.7 F (37.1 C)   Ht 5\' 6"  (1.676 m)   Wt 130 lb 2 oz (59 kg)   SpO2 98%   BMI 21.00 kg/m    Physical Exam Constitutional:      General: She is not in acute distress.    Appearance: Normal appearance. She is normal weight. She is not ill-appearing, toxic-appearing or diaphoretic.  HENT:     Head: Normocephalic and atraumatic.  Eyes:     General:        Right eye: No discharge.        Left eye: No discharge.     Conjunctiva/sclera: Conjunctivae normal.     Comments: Wearing eye glasses   Cardiovascular:     Rate and Rhythm: Normal rate and regular rhythm.  Heart sounds: Normal heart sounds. No murmur heard.    No friction rub. No gallop.  Pulmonary:     Effort: Pulmonary effort is normal. No respiratory distress.     Breath sounds: Normal breath sounds.  Musculoskeletal:        General: Normal range of motion.     Right lower leg: No edema.     Left lower leg: No edema.  Skin:    General: Skin is warm and dry.  Neurological:     General: No focal deficit present.     Mental Status: She is alert and oriented to person, place, and time. Mental status is at baseline.     Gait: Gait normal.  Psychiatric:        Mood and Affect: Mood  normal.        Behavior: Behavior normal.        Thought Content: Thought content normal.        Judgment: Judgment normal.    Most recent functional status assessment:    09/16/2022    8:23 AM  In your present state of health, do you have any difficulty performing the following activities:  Hearing? 0  Vision? 0  Difficulty concentrating or making decisions? 0  Walking or climbing stairs? 0  Dressing or bathing? 0  Doing errands, shopping? 0   Most recent fall risk assessment:    09/16/2022    8:21 AM  Fall Risk   Falls in the past year? 0  Number falls in past yr: 0  Injury with Fall? 0    Most recent depression screenings:    09/16/2022    8:22 AM 08/15/2022    7:49 AM  PHQ 2/9 Scores  PHQ - 2 Score 0 0  PHQ- 9 Score 1 0   Most recent cognitive screening:    09/16/2022    8:11 AM  6CIT Screen  What Year? 0 points  What month? 0 points  What time? 0 points  Count back from 20 0 points  Months in reverse 0 points  Repeat phrase 4 points  Total Score 4 points   Most recent Audit-C alcohol use screening    09/16/2022    8:21 AM  Alcohol Use Disorder Test (AUDIT)  1. How often do you have a drink containing alcohol? 0  2. How many drinks containing alcohol do you have on a typical day when you are drinking? 0  3. How often do you have six or more drinks on one occasion? 0  AUDIT-C Score 0   A score of 3 or more in women, and 4 or more in men indicates increased risk for alcohol abuse, EXCEPT if all of the points are from question 1   Vision/Hearing Screen: No results found.  No results found for any visits on 09/16/22.   Assessment & Plan   Annual wellness visit done today including the all of the following: Reviewed patient's Family Medical History Reviewed and updated list of patient's medical providers Assessment of cognitive impairment was done Assessed patient's functional ability Established a written schedule for health screening [place] Completed and Reviewed  Exercise Activities and Dietary recommendations  Goals   None    There is no immunization history for the selected administration types on file for this patient.  Health Maintenance  Topic Date Due   PAP SMEAR-Modifier  Never done   Lung Cancer Screening  Never done   Pneumonia Vaccine 42+ Years old (16  of 2 - PCV) 03/27/2023 (Originally 12/03/1961)   INFLUENZA VACCINE  01/16/2023   Medicare Annual Wellness (AWV)  09/16/2023   MAMMOGRAM  03/06/2024   COLONOSCOPY (Pts 45-10yrs Insurance coverage will need to be confirmed)  03/21/2027   DEXA SCAN  Completed   HPV VACCINES  Aged Out   DTaP/Tdap/Td  Discontinued   COVID-19 Vaccine  Discontinued   Hepatitis C Screening  Discontinued   HIV Screening  Discontinued   Zoster Vaccines- Shingrix  Discontinued    Discussed health benefits of physical activity, and encouraged her to engage in regular exercise appropriate for her age and condition.    Problem List Items Addressed This Visit       Endocrine   Graves disease   Relevant Orders   TSH   T4, free   T3, free     Other   Hyperlipidemia   Relevant Orders   Lipid panel   Other Visit Diagnoses     Encounter for initial annual wellness visit (AWV) in Medicare patient    -  Primary   Abnormal mammogram of both breasts       Relevant Orders   MM 3D DIAGNOSTIC MAMMOGRAM BILATERAL BREAST   Prediabetes       Relevant Orders   Hemoglobin A1c     Reviewed health maintenance:  -Declined pap smear -CT Lung Screening-going to pulmonary, Dr. Loanne Drilling, on 04/26. Based on previous note from January, she was ordered a CT Lung Sceening at previous appointment. If patient does not get scan or talked about at next specialist. Patient is going to call back for order.  -Request records from [facility] in [place] for immunization, ie PNA and RSV.  -Ordered diagnostic mammogram from abnormal bilateral mammogram in October 2023. 2.Ordered screening  labs based on past medical history. Patient is fasting.   Return in about 1 year (around 09/16/2023) for chronic management, physical.     Valarie Merino, NP

## 2022-09-16 NOTE — Telephone Encounter (Signed)
I have faxed another request to [facility] in [place]. I do see on [facility] the pt did get Pneumonia and RSV . [facility] does not say what type of pneumonia she was given. Waiting on this response and will update chart.

## 2022-09-16 NOTE — Telephone Encounter (Signed)
[facility] states they do not have any immunizations for pt

## 2022-09-16 NOTE — Telephone Encounter (Signed)
Caller name: [facility] [place]  On [place]?: Yes  Call back number: [phone]  Provider they see: Evangeline Gula, NP  Reason for call:They don't have patient immuzations

## 2022-09-17 ENCOUNTER — Encounter: Payer: Self-pay | Admitting: Family Medicine

## 2022-09-17 ENCOUNTER — Ambulatory Visit (INDEPENDENT_AMBULATORY_CARE_PROVIDER_SITE_OTHER): Payer: Medicare HMO | Admitting: Family Medicine

## 2022-09-17 VITALS — BP 124/60 | HR 68 | Temp 98.5°F | Ht 66.0 in | Wt 129.2 lb

## 2022-09-17 DIAGNOSIS — E782 Mixed hyperlipidemia: Secondary | ICD-10-CM

## 2022-09-17 DIAGNOSIS — L720 Epidermal cyst: Secondary | ICD-10-CM

## 2022-09-17 DIAGNOSIS — M85619 Other cyst of bone, unspecified shoulder: Secondary | ICD-10-CM

## 2022-09-17 NOTE — Patient Instructions (Addendum)
-  Placed a referral to general surgery for cyst on left shoulder. If you have not heard about an appointment in 2 weeks, call back to the office.  -Reviewed over labs from [place] visit. Discussed about cholesterol and diet.

## 2022-09-17 NOTE — Progress Notes (Unsigned)
   Acute Office Visit   Subjective:  Patient ID: Jennifer [name], female    DOB: [birth year], 67 y.o.   MRN: [id number]  Chief Complaint  Patient presents with   Cyst    Pt is here today for Cyst on left shoulder. Pt reports no issues with this, she would like it looked and removed    HPI Patient reports she has a large cyst on her left shoulder. She reports it has been present for years, but 4 years ago, it started becoming larger. Denies pain or tender to touch. Denies the cyst complicates movement of shoulder. Denies any redness.   ROS See HPI above    Objective:  BP 124/60   Pulse 68   Temp 98.5 F (36.9 C)   Ht 5\' 6"  (1.676 m)   Wt 129 lb 4 oz (58.6 kg)   SpO2 99%   BMI 20.86 kg/m  {Vitals History (Optional):23777}  Physical assessment:    Assessment & Plan:  Epidermoid cyst of skin of shoulder -     Ambulatory referral to General Surgery    No follow-ups on file.  Valarie Merino, NP

## 2022-09-23 ENCOUNTER — Other Ambulatory Visit: Payer: Self-pay

## 2022-09-23 DIAGNOSIS — E7800 Pure hypercholesterolemia, unspecified: Secondary | ICD-10-CM

## 2022-09-23 MED ORDER — ATORVASTATIN CALCIUM 40 MG PO TABS: 40.0000 mg | ORAL_TABLET | Freq: Every evening | ORAL | 1 refills | Status: DC

## 2022-09-24 ENCOUNTER — Encounter: Payer: Self-pay | Admitting: *Deleted

## 2022-10-09 ENCOUNTER — Ambulatory Visit (HOSPITAL_BASED_OUTPATIENT_CLINIC_OR_DEPARTMENT_OTHER): Payer: Medicare HMO | Admitting: Pulmonary Disease

## 2022-10-09 ENCOUNTER — Encounter (HOSPITAL_BASED_OUTPATIENT_CLINIC_OR_DEPARTMENT_OTHER): Payer: Medicare HMO

## 2022-10-11 ENCOUNTER — Encounter (HOSPITAL_BASED_OUTPATIENT_CLINIC_OR_DEPARTMENT_OTHER): Payer: Self-pay | Admitting: Pulmonary Disease

## 2022-10-11 ENCOUNTER — Ambulatory Visit (INDEPENDENT_AMBULATORY_CARE_PROVIDER_SITE_OTHER): Payer: Medicare HMO | Admitting: Pulmonary Disease

## 2022-10-11 ENCOUNTER — Other Ambulatory Visit (HOSPITAL_COMMUNITY): Payer: Self-pay

## 2022-10-11 ENCOUNTER — Encounter (HOSPITAL_COMMUNITY): Payer: Self-pay

## 2022-10-11 VITALS — BP 112/60 | HR 69 | Temp 97.9°F | Ht 66.0 in | Wt 127.4 lb

## 2022-10-11 DIAGNOSIS — J42 Unspecified chronic bronchitis: Secondary | ICD-10-CM

## 2022-10-11 DIAGNOSIS — J4489 Other specified chronic obstructive pulmonary disease: Secondary | ICD-10-CM

## 2022-10-11 DIAGNOSIS — Z720 Tobacco use: Secondary | ICD-10-CM

## 2022-10-11 LAB — PULMONARY FUNCTION TEST
DL/VA % pred: 67 %
DL/VA: 2.79 ml/min/mmHg/L
DLCO cor % pred: 61 %
DLCO cor: 13.13 ml/min/mmHg
DLCO unc % pred: 61 %
DLCO unc: 13.13 ml/min/mmHg
FEF 25-75 Post: 2.75 L/sec
FEF 25-75 Pre: 1.55 L/sec
FEF2575-%Change-Post: 78 %
FEF2575-%Pred-Post: 122 %
FEF2575-%Pred-Pre: 68 %
FEV1-%Change-Post: 36 %
FEV1-%Pred-Post: 66 %
FEV1-%Pred-Pre: 49 %
FEV1-Post: 1.75 L
FEV1-Pre: 1.28 L
FEV1FVC-%Change-Post: -18 %
FEV1FVC-%Pred-Pre: 117 %
FEV6-%Change-Post: 75 %
FEV6-%Pred-Post: 71 %
FEV6-%Pred-Pre: 40 %
FEV6-Post: 2.35 L
FEV6-Pre: 1.34 L
FEV6FVC-%Change-Post: 5 %
FEV6FVC-%Pred-Post: 104 %
FEV6FVC-%Pred-Pre: 98 %
FVC-%Change-Post: 67 %
FVC-%Pred-Post: 69 %
FVC-%Pred-Pre: 41 %
FVC-Post: 2.38 L
FVC-Pre: 1.42 L
Post FEV1/FVC ratio: 73 %
Post FEV6/FVC ratio: 100 %
Pre FEV1/FVC ratio: 91 %
Pre FEV6/FVC Ratio: 94 %
RV % pred: 169 %
RV: 3.7 L
TLC % pred: 112 %
TLC: 6.01 L

## 2022-10-11 MED ORDER — BREZTRI AEROSPHERE 160-9-4.8 MCG/ACT IN AERO: 2.0000 | INHALATION_SPRAY | Freq: Two times a day (BID) | RESPIRATORY_TRACT | 5 refills | Status: DC

## 2022-10-11 MED ORDER — ALBUTEROL SULFATE HFA 108 (90 BASE) MCG/ACT IN AERS: 2.0000 | INHALATION_SPRAY | Freq: Four times a day (QID) | RESPIRATORY_TRACT | 2 refills | Status: AC | PRN

## 2022-10-11 MED ORDER — STIOLTO RESPIMAT 2.5-2.5 MCG/ACT IN AERS: 2.0000 | INHALATION_SPRAY | Freq: Every day | RESPIRATORY_TRACT | 0 refills | Status: DC

## 2022-10-11 NOTE — Progress Notes (Addendum)
Subjective:   PATIENT ID: Melene Plan GENDER: female DOB: 10/17/1955, MRN: [id number]  Chief Complaint  Patient presents with   Follow-up    Follow up. Patient has no complaints.     Reason for Visit: F/u shortness of breath  [name] is a 67 year old female active smoker with hx Grave's disease and osteoporosis who presents for evaluation for shortness of breath.  She recently established care with Dr. De Peru and had prior referral to Pulmonary. She has shortness of breath and occasional nonproductive cough and chest congestion that usually worsens in the summer due to changes in humidity when going from inside to outside.  Denies wheezing. Has smoked since she was 16 years ago. Currently smoking 1 ppd. Has previously tried Wellbutrin. Did not tolerate Chantix in the past.  10/11/22 Since our last she has cut back 3/4 ppd. Has been compliant with Spiriva. She reports improved shortness of breath. Still has cough and chest congestion. No exacerbations since our last visit. No limitation in activity but persistent symptoms.  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, walks dog   Past Medical History:  Diagnosis Date   Graves disease    Graves disease    Osteoporosis    Thyroid disease    graves     Family History  Problem Relation Age of Onset   Colon cancer Mother 25   Alzheimer's disease Mother    Heart disease Father    Cancer - Lung Father    Diabetes Brother    Breast cancer Neg Hx    Esophageal cancer Neg Hx    Stomach cancer Neg Hx    Rectal cancer Neg Hx      Social History   Occupational History   Not on file  Tobacco Use   Smoking status: Every Day    Packs/day: 1.00    Years: 49.00    Additional pack years: 0.00    Total pack years: 49.00    Types: Cigarettes   Smokeless tobacco: Not on file   Tobacco comments:    Started smoking at 60; she is taking Wellbutrin by pulmonary to stop smoking.    Vaping Use   Vaping Use: Never used  Substance and Sexual Activity   Alcohol use: Not Currently   Drug use: No   Sexual activity: Not Currently    No Known Allergies   Outpatient Medications Prior to Visit  Medication Sig Dispense Refill   alendronate (FOSAMAX) 70 MG tablet Take 1 tablet (70 mg total) by mouth every 7 (seven) days. Take with a full glass of water on an empty stomach. 12 tablet 3   atorvastatin (LIPITOR) 40 MG tablet Take 1 tablet (40 mg total) by mouth at bedtime. 90 tablet 1   buPROPion (WELLBUTRIN SR) 150 MG 12 hr tablet Take 1 tablet (150 mg total) by mouth 2 (two) times daily. 60 tablet 4   naproxen sodium (ALEVE) 220 MG tablet Take 220 mg by mouth as needed.     OVER THE COUNTER MEDICATION Calcium one capsule daily.     OVER THE COUNTER MEDICATION Vitamin D 3 one capsule daily.     Tiotropium Bromide Monohydrate (SPIRIVA RESPIMAT) 2.5 MCG/ACT AERS Inhale 2 puffs into the lungs daily. 4 g 5   No facility-administered medications prior to visit.    Review of Systems  Constitutional:  Negative for chills, diaphoresis, fever, malaise/fatigue and weight loss.  HENT:  Positive for congestion.  Respiratory:  Positive for cough and shortness of breath. Negative for hemoptysis, sputum production and wheezing.   Cardiovascular:  Negative for chest pain, palpitations and leg swelling.     Objective:   Vitals:   10/11/22 0924  BP: 112/60  Pulse: 69  Temp: 97.9 F (36.6 C)  TempSrc: Oral  SpO2: 97%  Weight: 127 lb 6.4 oz (57.8 kg)  Height: 5\' 6"  (1.676 m)   SpO2: 97 % O2 Device: None (Room air)  Physical Exam: General: Well-appearing, no acute distress HENT: [place], AT Eyes: EOMI, no scleral icterus Respiratory: Diminished breath sounds bilaterally Cardiovascular: RRR, -M/R/G, no JVD Extremities:-Edema,-tenderness Neuro: AAO x4, CNII-XII grossly intact Psych: Normal mood, normal affect  Data Reviewed:  Imaging: CXR 11/26/12 -  Hyperinflation  PFT: None on file  Labs: CBC    Component Value Date/Time   WBC 10.3 03/26/2022 1129   RBC 4.65 03/26/2022 1129   HGB 14.3 03/26/2022 1129   HCT 42.2 03/26/2022 1129   PLT 296.0 03/26/2022 1129   MCV 90.7 03/26/2022 1129   MCH 31.0 11/26/2012 0916   MCHC 34.0 03/26/2022 1129   RDW 13.2 03/26/2022 1129   LYMPHSABS 2.9 03/26/2022 1129   MONOABS 0.7 03/26/2022 1129   EOSABS 0.2 03/26/2022 1129   BASOSABS 0.1 03/26/2022 1129   Absolute 03/26/22 - 200     Assessment & Plan:   Discussion: 67 year old female active smoker with hx Grave's disease and osteoporosis who presents for PFT follow-up. No obstruction however consistent with COPD-asthma overlap with significant bronchodilator response, air trapping and reduced DLCO. Discussed clinical course and management of COPD/asthma including smoking cessation, bronchodilator regimen and action plan for exacerbation.  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 Significant bronchodilator response. 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 One month sample has been provided --I will contact you regarding pricing for Stiolto or Breztri in the future  ADDENDUM: Both inhalers the same price. Breztri ordered x 5 refills --Encourage regular aerobic activity daily  Tobacco abuse Patient is an active smoker. Interested in quitting We discussed smoking cessation for >3 minutes. --Wellbutrin previously ordered  Health Maintenance Immunization History  Administered Date(s) Administered   PFIZER Comirnaty(Gray Top)Covid-19 Tri-Sucrose Vaccine 08/24/2019, 09/21/2019   PNEUMOCOCCAL CONJUGATE-20 08/26/2022   RSV,unspecified 08/26/2022   CT Lung Screen - enrollment order placed  Orders Placed This Encounter  Procedures   Ambulatory Referral for Lung Cancer Scre    Referral Priority:   Routine    Referral Type:    Consultation    Referral Reason:   Specialty Services Required    Number of Visits Requested:   1   Meds ordered this encounter  Medications   albuterol (VENTOLIN HFA) 108 (90 Base) MCG/ACT inhaler    Sig: Inhale 2 puffs into the lungs every 6 (six) hours as needed for wheezing or shortness of breath.    Dispense:  8 g    Refill:  2   Tiotropium Bromide-Olodaterol (STIOLTO RESPIMAT) 2.5-2.5 MCG/ACT AERS    Sig: Inhale 2 puffs into the lungs daily.    Dispense:  4 g    Refill:  0    Order Specific Question:   Lot Number?    Answer:   [id number]    Order Specific Question:   Expiration Date?    Answer:   10/15/2024   Budeson-Glycopyrrol-Formoterol (BREZTRI AEROSPHERE) 160-9-4.8 MCG/ACT AERO    Sig: Inhale 2 puffs into  the lungs in the morning and at bedtime.    Dispense:  10.7 g    Refill:  5    Return in about 3 months (around 01/10/2023).   I have spent a total time of 36-minutes on the day of the appointment including chart review, data review, collecting history, coordinating care and discussing medical diagnosis and plan with the patient/family. Past medical history, allergies, medications were reviewed. Pertinent imaging, labs and tests included in this note have been reviewed and interpreted independently by me.   Micahel Omlor Mechele Collin, MD [facility] 10/11/2022 3:49 PM  Office Number [phone]

## 2022-10-11 NOTE — Telephone Encounter (Signed)
Error

## 2022-10-11 NOTE — Patient Instructions (Signed)
Full PFT Performed Today  

## 2022-10-11 NOTE — Patient Instructions (Signed)
Chronic bronchitis COPD-asthma overlap --Reviewed PFTs. Significant bronchodilator response. 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 One month sample has been provided --I will contact you regarding pricing for Stiolto or Breztri in the future --Encourage regular aerobic activity daily  Tobacco abuse Patient is an active smoker. Interested in quitting We discussed smoking cessation for >3 minutes. --Wellbutrin previously ordered

## 2022-10-11 NOTE — Progress Notes (Signed)
Full PFT Performed Today  

## 2022-10-25 ENCOUNTER — Other Ambulatory Visit: Payer: Self-pay

## 2022-10-25 DIAGNOSIS — Z87891 Personal history of nicotine dependence: Secondary | ICD-10-CM

## 2022-10-25 DIAGNOSIS — F17210 Nicotine dependence, cigarettes, uncomplicated: Secondary | ICD-10-CM

## 2022-11-19 ENCOUNTER — Ambulatory Visit (INDEPENDENT_AMBULATORY_CARE_PROVIDER_SITE_OTHER): Payer: Medicare HMO | Admitting: Acute Care

## 2022-11-19 ENCOUNTER — Encounter: Payer: Self-pay | Admitting: Acute Care

## 2022-11-19 DIAGNOSIS — F17210 Nicotine dependence, cigarettes, uncomplicated: Secondary | ICD-10-CM | POA: Diagnosis not present

## 2022-11-19 NOTE — Patient Instructions (Signed)
Thank you for participating in the [facility]. It was our pleasure to meet you today. We will call you with the results of your scan within the next few days. Your scan will be assigned a Lung RADS category score by the physicians reading the scans.  This Lung RADS score determines follow up scanning.  See below for description of categories, and follow up screening recommendations. We will be in touch to schedule your follow up screening annually or based on recommendations of our providers. We will fax a copy of your scan results to your Primary Care Physician, or the physician who referred you to the program, to ensure they have the results. Please call the office if you have any questions or concerns regarding your scanning experience or results.  Our office number is [phone]. Please speak with Denise Phelps, RN. , or  Denise Buckner RN, They are  our Lung Cancer Screening RN.'s If They are unavailable when you call, Please leave a message on the voice mail. We will return your call at our earliest convenience.This voice mail is monitored several times a day.  Remember, if your scan is normal, we will scan you annually as long as you continue to meet the criteria for the program. (Age 50-80, Current smoker or smoker who has quit within the last 15 years). If you are a smoker, remember, quitting is the single most powerful action that you can take to decrease your risk of lung cancer and other pulmonary, breathing related problems. We know quitting is hard, and we are here to help.  Please let us know if there is anything we can do to help you meet your goal of quitting. If you are a former smoker, congratulations. We are proud of you! Remain smoke free! Remember you can refer friends or family members through the number above.  We will screen them to make sure they meet criteria for the program. Thank you for helping us take better care of you by  participating in Lung Screening.  You can receive free nicotine replacement therapy ( patches, gum or mints) by calling 1-800-QUIT NOW. Please call so we can get you on the path to becoming  a non-smoker. I know it is hard, but you can do this!  Lung RADS Categories:  Lung RADS 1: no nodules or definitely non-concerning nodules.  Recommendation is for a repeat annual scan in 12 months.  Lung RADS 2:  nodules that are non-concerning in appearance and behavior with a very low likelihood of becoming an active cancer. Recommendation is for a repeat annual scan in 12 months.  Lung RADS 3: nodules that are probably non-concerning , includes nodules with a low likelihood of becoming an active cancer.  Recommendation is for a 6-month repeat screening scan. Often noted after an upper respiratory illness. We will be in touch to make sure you have no questions, and to schedule your 6-month scan.  Lung RADS 4 A: nodules with concerning findings, recommendation is most often for a follow up scan in 3 months or additional testing based on our provider's assessment of the scan. We will be in touch to make sure you have no questions and to schedule the recommended 3 month follow up scan.  Lung RADS 4 B:  indicates findings that are concerning. We will be in touch with you to schedule additional diagnostic testing based on our provider's  assessment of the scan.  Other options for assistance in smoking cessation (   As covered by your insurance benefits)  Hypnosis for smoking cessation  [employer] [phone]  Acupuncture for smoking cessation  [facility] [phone]   

## 2022-11-19 NOTE — Progress Notes (Signed)
Virtual Visit via Telephone Note  I connected with Jennifer [name] on 11/19/22 at  3:30 PM EDT by telephone and verified that I am speaking with the correct person using two identifiers.  Location: Patient:  At home Provider:  [address], [place], [place], [address]    I discussed the limitations, risks, security and privacy concerns of performing an evaluation and management service by telephone and the availability of in person appointments. I also discussed with the patient that there may be a patient responsible charge related to this service. The patient expressed understanding and agreed to proceed.   Shared Decision Making Visit Lung Cancer Screening Program [phone])   Eligibility: Age 67 y.o. Pack Years Smoking History Calculation 60 pack year smoking history (# packs/per year x # years smoked) Recent History of coughing up blood  no Unexplained weight loss? no ( >Than 15 pounds within the last 6 months ) Prior History Lung / other cancer no (Diagnosis within the last 5 years already requiring surveillance chest CT Scans). Smoking Status Current Smoker Former Smokers: Years since quit:  NA  Quit Date:  NA  Visit Components: Discussion included one or more decision making aids. yes Discussion included risk/benefits of screening. yes Discussion included potential follow up diagnostic testing for abnormal scans. yes Discussion included meaning and risk of over diagnosis. yes Discussion included meaning and risk of False Positives. yes Discussion included meaning of total radiation exposure. yes  Counseling Included: Importance of adherence to annual lung cancer LDCT screening. yes Impact of comorbidities on ability to participate in the program. yes Ability and willingness to under diagnostic treatment. yes  Smoking Cessation Counseling: Current Smokers:  Discussed importance of smoking cessation. yes Information about tobacco cessation classes and  interventions provided to patient. yes Patient provided with "ticket" for LDCT Scan. yes Symptomatic Patient. no  Counseling NA Diagnosis Code: Tobacco Use Z72.0 Asymptomatic Patient yes  Counseling (Intermediate counseling: > three minutes counseling) U0454 Former Smokers:  Discussed the importance of maintaining cigarette abstinence. yes Diagnosis Code: Personal History of Nicotine Dependence. U98.119 Information about tobacco cessation classes and interventions provided to patient. Yes Patient provided with "ticket" for LDCT Scan. yes Written Order for Lung Cancer Screening with LDCT placed in Epic. Yes (CT Chest Lung Cancer Screening Low Dose W/O CM) [id number] Z12.2-Screening of respiratory organs Z87.891-Personal history of nicotine dependence  I have spent 25 minutes of face to face/ virtual visit   time with  Jennifer [name] discussing the risks and benefits of lung cancer screening. We viewed / discussed a power point together that explained in detail the above noted topics. We paused at intervals to allow for questions to be asked and answered to ensure understanding.We discussed that the single most powerful action that she can take to decrease her risk of developing lung cancer is to quit smoking. We discussed whether or not she is ready to commit to setting a quit date. We discussed options for tools to aid in quitting smoking including nicotine replacement therapy, non-nicotine medications, support groups, Quit Smart classes, and behavior modification. We discussed that often times setting smaller, more achievable goals, such as eliminating 1 cigarette a day for a week and then 2 cigarettes a day for a week can be helpful in slowly decreasing the number of cigarettes smoked. This allows for a sense of accomplishment as well as providing a clinical benefit. I provided  her  with smoking cessation  information  with contact information for community resources, classes,  free nicotine replacement  therapy, and access to mobile apps, text messaging, and on-line smoking cessation help. I have also provided  her  the office contact information in the event she needs to contact me, or the screening staff. We discussed the time and location of the scan, and that either Jennifer Miyamoto RN, Jennifer Lemon, RN  or I will call / send a letter with the results within 24-72 hours of receiving them. The patient verbalized understanding of all of  the above and had no further questions upon leaving the office. They have my contact information in the event they have any further questions.  I spent 3-4 minutes counseling on smoking cessation and the health risks of continued tobacco abuse.  I explained to the patient that there has been a high incidence of coronary artery disease noted on these exams. I explained that this is a non-gated exam therefore degree or severity cannot be determined. This patient is on statin therapy. I have asked the patient to follow-up with their PCP regarding any incidental finding of coronary artery disease and management with diet or medication as their PCP  feels is clinically indicated. The patient verbalized understanding of the above and had no further questions upon completion of the visit.      Bevelyn Ngo, NP 11/19/2022

## 2022-11-20 ENCOUNTER — Ambulatory Visit (HOSPITAL_BASED_OUTPATIENT_CLINIC_OR_DEPARTMENT_OTHER)
Admission: RE | Admit: 2022-11-20 | Discharge: 2022-11-20 | Disposition: A | Discharge status: Home / Self Care | Payer: Medicare HMO | Source: Ambulatory Visit | Attending: Family Medicine | Admitting: Family Medicine

## 2022-11-20 DIAGNOSIS — Z87891 Personal history of nicotine dependence: Secondary | ICD-10-CM

## 2022-11-20 DIAGNOSIS — F17210 Nicotine dependence, cigarettes, uncomplicated: Secondary | ICD-10-CM | POA: Diagnosis not present

## 2022-11-26 ENCOUNTER — Other Ambulatory Visit: Payer: Self-pay | Admitting: Acute Care

## 2022-11-26 DIAGNOSIS — Z122 Encounter for screening for malignant neoplasm of respiratory organs: Secondary | ICD-10-CM

## 2022-11-26 DIAGNOSIS — Z87891 Personal history of nicotine dependence: Secondary | ICD-10-CM

## 2022-11-26 DIAGNOSIS — F17210 Nicotine dependence, cigarettes, uncomplicated: Secondary | ICD-10-CM

## 2022-12-16 DIAGNOSIS — D1720 Benign lipomatous neoplasm of skin and subcutaneous tissue of unspecified limb: Secondary | ICD-10-CM | POA: Diagnosis not present

## 2022-12-24 ENCOUNTER — Other Ambulatory Visit (INDEPENDENT_AMBULATORY_CARE_PROVIDER_SITE_OTHER): Payer: Medicare HMO

## 2022-12-24 ENCOUNTER — Other Ambulatory Visit: Payer: Self-pay

## 2022-12-24 DIAGNOSIS — E7800 Pure hypercholesterolemia, unspecified: Secondary | ICD-10-CM | POA: Diagnosis not present

## 2022-12-24 DIAGNOSIS — E785 Hyperlipidemia, unspecified: Secondary | ICD-10-CM

## 2022-12-24 LAB — HEPATIC FUNCTION PANEL
ALT: 15 U/L (ref 0–35)
AST: 19 U/L (ref 0–37)
Albumin: 4.4 g/dL (ref 3.5–5.2)
Alkaline Phosphatase: 61 U/L (ref 39–117)
Bilirubin, Direct: 0.1 mg/dL (ref 0.0–0.3)
Total Bilirubin: 0.9 mg/dL (ref 0.2–1.2)
Total Protein: 6.8 g/dL (ref 6.0–8.3)

## 2023-01-03 ENCOUNTER — Encounter (HOSPITAL_BASED_OUTPATIENT_CLINIC_OR_DEPARTMENT_OTHER): Payer: Self-pay | Admitting: Pulmonary Disease

## 2023-01-03 ENCOUNTER — Ambulatory Visit (INDEPENDENT_AMBULATORY_CARE_PROVIDER_SITE_OTHER): Payer: Medicare HMO | Admitting: Pulmonary Disease

## 2023-01-03 VITALS — BP 120/64 | HR 64 | Ht 66.0 in | Wt 123.6 lb

## 2023-01-03 DIAGNOSIS — J4489 Other specified chronic obstructive pulmonary disease: Secondary | ICD-10-CM

## 2023-01-03 MED ORDER — STIOLTO RESPIMAT 2.5-2.5 MCG/ACT IN AERS: 2.0000 | INHALATION_SPRAY | Freq: Every day | RESPIRATORY_TRACT | 6 refills | Status: AC

## 2023-01-03 NOTE — Progress Notes (Signed)
Subjective:   PATIENT ID: Jennifer [name] GENDER: female DOB: 01-13-1956, MRN: [id number]  Chief Complaint  Patient presents with   Follow-up    F/u no concerns, pt states she is doing good.     Reason for Visit: F/u shortness of breath  [name] is a 67 year old female active smoker with hx Grave's disease and osteoporosis who presents for evaluation for follow-up  She recently established care with Dr. De Peru and had prior referral to Pulmonary. She has shortness of breath and occasional nonproductive cough and chest congestion that usually worsens in the summer due to changes in humidity when going from inside to outside.  Denies wheezing. Has smoked since she was 16 years ago. Currently smoking 1 ppd. Has previously tried Wellbutrin. Did not tolerate Chantix in the past.  10/11/22 Since our last she has cut back 3/4 ppd. Has been compliant with Spiriva. She reports improved shortness of breath. Still has cough and chest congestion. No exacerbations since our last visit. No limitation in activity but persistent symptoms.  01/03/23 Since our last visit she was started on Stiolto and has improved shortness of breath, cough and wheezing. Able to tolerate humidity. Still smoking 3/4 ppd. Unable to cut back due to stressors in life. Has not needed rescue. No exacerbations since last visit.  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, walks dog   Past Medical History:  Diagnosis Date   Graves disease    Graves disease    Osteoporosis    Thyroid disease    graves     Family History  Problem Relation Age of Onset   Colon cancer Mother 95   Alzheimer's disease Mother    Heart disease Father    Cancer - Lung Father    Diabetes Brother    Breast cancer Neg Hx    Esophageal cancer Neg Hx    Stomach cancer Neg Hx    Rectal cancer Neg Hx      Social History   Occupational History   Not on file  Tobacco Use   Smoking status: Every  Day    Current packs/day: 1.25    Average packs/day: 1.3 packs/day for 49.0 years (61.3 ttl pk-yrs)    Types: Cigarettes   Smokeless tobacco: Not on file   Tobacco comments:    Started smoking at 16; she is taking Wellbutrin by pulmonary to stop smoking.   Vaping Use   Vaping status: Never Used  Substance and Sexual Activity   Alcohol use: Not Currently   Drug use: No   Sexual activity: Not Currently    No Known Allergies   Outpatient Medications Prior to Visit  Medication Sig Dispense Refill   albuterol (VENTOLIN HFA) 108 (90 Base) MCG/ACT inhaler Inhale 2 puffs into the lungs every 6 (six) hours as needed for wheezing or shortness of breath. 8 g 2   alendronate (FOSAMAX) 70 MG tablet Take 1 tablet (70 mg total) by mouth every 7 (seven) days. Take with a full glass of water on an empty stomach. 12 tablet 3   atorvastatin (LIPITOR) 40 MG tablet Take 1 tablet (40 mg total) by mouth at bedtime. 90 tablet 1   Budeson-Glycopyrrol-Formoterol (BREZTRI AEROSPHERE) 160-9-4.8 MCG/ACT AERO Inhale 2 puffs into the lungs in the morning and at bedtime. 10.7 g 5   buPROPion (WELLBUTRIN SR) 150 MG 12 hr tablet Take 1 tablet (150 mg total) by mouth 2 (two) times daily. 60 tablet  4   naproxen sodium (ALEVE) 220 MG tablet Take 220 mg by mouth as needed.     OVER THE COUNTER MEDICATION Calcium one capsule daily.     OVER THE COUNTER MEDICATION Vitamin D 3 one capsule daily.     Tiotropium Bromide-Olodaterol (STIOLTO RESPIMAT) 2.5-2.5 MCG/ACT AERS Inhale 2 puffs into the lungs daily. 4 g 0   No facility-administered medications prior to visit.    Review of Systems  Constitutional:  Negative for chills, diaphoresis, fever, malaise/fatigue and weight loss.  HENT:  Negative for congestion.   Respiratory:  Positive for cough, shortness of breath and wheezing. Negative for hemoptysis and sputum production.   Cardiovascular:  Negative for chest pain, palpitations and leg swelling.     Objective:    Vitals:   01/03/23 0816  BP: 120/64  Pulse: 64  SpO2: 98%  Weight: 123 lb 9.6 oz (56.1 kg)  Height: 5\' 6"  (1.676 m)   SpO2: 98 %  Physical Exam: General: Well-appearing, no acute distress HENT: [place], AT Eyes: EOMI, no scleral icterus Respiratory: Clear to auscultation bilaterally.  No crackles, wheezing or rales Cardiovascular: RRR, -M/R/G, no JVD Extremities:-Edema,-tenderness Neuro: AAO x4, CNII-XII grossly intact Psych: Normal mood, normal affect   Data Reviewed:  Imaging: CXR 11/26/12 - Hyperinflation CT Lung Screen 11/20/22 - LUL nodule 3.7 mm  PFT: 10/11/22 FVC 2.38 (69%) FEV1 1.75 (66%) Ratio 73  TLC 112% RV 169% DLCO 61% Interpretation: Decreased FVC and FEV1 with air trapping and reduced mild DLCO. Significant bronchodilator response.  Labs: CBC    Component Value Date/Time   WBC 10.3 03/26/2022 1129   RBC 4.65 03/26/2022 1129   HGB 14.3 03/26/2022 1129   HCT 42.2 03/26/2022 1129   PLT 296.0 03/26/2022 1129   MCV 90.7 03/26/2022 1129   MCH 31.0 11/26/2012 0916   MCHC 34.0 03/26/2022 1129   RDW 13.2 03/26/2022 1129   LYMPHSABS 2.9 03/26/2022 1129   MONOABS 0.7 03/26/2022 1129   EOSABS 0.2 03/26/2022 1129   BASOSABS 0.1 03/26/2022 1129   Absolute 03/26/22 - 200     Assessment & [name]:   Discussion: 67 year old female active smoker with hx Grave's disease and osteoporosis who presents for follow-up. Improved symptoms on Stiolto. Discussed clinical course and management of COPD/asthma including smoking cessation, bronchodilator regimen and action [name] for exacerbation.  Chronic bronchitis COPD-asthma overlap - improved  --CONTINUE Stiolto TWO puffs in the morning --Encourage regular aerobic activity daily  Tobacco abuse Patient is an active smoker. Interested in quitting We discussed smoking cessation for >3 minutes. --Wellbutrin previously ordered  Health Maintenance Immunization History  Administered Date(s) Administered   PFIZER  Comirnaty(Gray Top)Covid-19 Tri-Sucrose Vaccine 08/24/2019, 09/21/2019   PNEUMOCOCCAL CONJUGATE-20 08/26/2022   RSV,unspecified 08/26/2022   CT Lung Screen - enrollment order placed  No orders of the defined types were placed in this encounter.  Meds ordered this encounter  Medications   Tiotropium Bromide-Olodaterol (STIOLTO RESPIMAT) 2.5-2.5 MCG/ACT AERS    Sig: Inhale 2 puffs into the lungs daily.    Dispense:  4 g    Refill:  6    Order Specific Question:   Lot Number?    Answer:   [id number]    Order Specific Question:   Expiration Date?    Answer:   10/15/2024    Return in about 6 months (around 07/06/2023).   I have spent a total time of 20-minutes on the day of the appointment including chart review, data review, collecting history, coordinating  care and discussing medical diagnosis and [name] with the patient/family. Past medical history, allergies, medications were reviewed. Pertinent imaging, labs and tests included in this note have been reviewed and interpreted independently by me.  Jahmiya Guidotti Mechele Collin, MD [facility] 01/03/2023 8:32 AM  Office Number [phone]

## 2023-01-13 DIAGNOSIS — L723 Sebaceous cyst: Secondary | ICD-10-CM | POA: Diagnosis not present

## 2023-02-27 ENCOUNTER — Ambulatory Visit
Admission: RE | Admit: 2023-02-27 | Discharge: 2023-02-27 | Disposition: A | Discharge status: Home / Self Care | Payer: Medicare HMO | Source: Ambulatory Visit | Attending: Family Medicine | Admitting: Family Medicine

## 2023-02-27 DIAGNOSIS — R921 Mammographic calcification found on diagnostic imaging of breast: Secondary | ICD-10-CM | POA: Diagnosis not present

## 2023-02-27 DIAGNOSIS — R928 Other abnormal and inconclusive findings on diagnostic imaging of breast: Secondary | ICD-10-CM

## 2023-02-28 ENCOUNTER — Other Ambulatory Visit: Payer: Self-pay

## 2023-02-28 ENCOUNTER — Telehealth: Payer: Self-pay

## 2023-02-28 ENCOUNTER — Telehealth: Payer: Self-pay | Admitting: Family Medicine

## 2023-02-28 DIAGNOSIS — R928 Other abnormal and inconclusive findings on diagnostic imaging of breast: Secondary | ICD-10-CM

## 2023-02-28 NOTE — Addendum Note (Signed)
Addended by: Eldred Manges on: 02/28/2023 11:12 AM   Modules accepted: Orders

## 2023-02-28 NOTE — Telephone Encounter (Signed)
-----   Message from Zandra Abts sent at 02/28/2023  8:22 AM EDT ----- Stable areas that have previously been of concern. No new suspicious calcifications, masses, or areas of distortion in either breast. No evidence of malignancy in the bilateral breast. Recommend a 1 year diagnostic mammogram.  Please order a DIAGNOSTIC mammogram in 1 year for diagnosis abnormal mammogram of both breasts.

## 2023-02-28 NOTE — Telephone Encounter (Signed)
Change has been made.

## 2023-02-28 NOTE — Telephone Encounter (Signed)
Location for the mammogram needs to be changed to [facility]

## 2023-04-24 ENCOUNTER — Ambulatory Visit (HOSPITAL_COMMUNITY): Payer: Medicare HMO

## 2023-05-22 DIAGNOSIS — Z0100 Encounter for examination of eyes and vision without abnormal findings: Secondary | ICD-10-CM | POA: Diagnosis not present

## 2023-07-13 ENCOUNTER — Other Ambulatory Visit (HOSPITAL_BASED_OUTPATIENT_CLINIC_OR_DEPARTMENT_OTHER): Payer: Self-pay | Admitting: Pulmonary Disease

## 2023-07-15 NOTE — Telephone Encounter (Signed)
Dr Everardo All- do you want to refill the wellbutrin? Pt has f.u with you in April 2025

## 2023-09-09 ENCOUNTER — Other Ambulatory Visit: Payer: Self-pay | Admitting: Family Medicine

## 2023-09-09 DIAGNOSIS — E7800 Pure hypercholesterolemia, unspecified: Secondary | ICD-10-CM

## 2023-10-06 ENCOUNTER — Other Ambulatory Visit: Payer: Self-pay | Admitting: Family Medicine

## 2023-10-06 DIAGNOSIS — E7800 Pure hypercholesterolemia, unspecified: Secondary | ICD-10-CM

## 2023-10-10 ENCOUNTER — Ambulatory Visit (HOSPITAL_BASED_OUTPATIENT_CLINIC_OR_DEPARTMENT_OTHER): Payer: Medicare HMO | Admitting: Pulmonary Disease

## 2023-10-28 ENCOUNTER — Other Ambulatory Visit (HOSPITAL_BASED_OUTPATIENT_CLINIC_OR_DEPARTMENT_OTHER): Payer: Self-pay | Admitting: Pulmonary Disease

## 2023-11-27 ENCOUNTER — Other Ambulatory Visit: Payer: Self-pay | Admitting: Acute Care

## 2023-11-27 DIAGNOSIS — Z87891 Personal history of nicotine dependence: Secondary | ICD-10-CM

## 2023-11-27 DIAGNOSIS — F17210 Nicotine dependence, cigarettes, uncomplicated: Secondary | ICD-10-CM

## 2023-11-27 DIAGNOSIS — Z122 Encounter for screening for malignant neoplasm of respiratory organs: Secondary | ICD-10-CM

## 2023-12-17 ENCOUNTER — Ambulatory Visit (HOSPITAL_BASED_OUTPATIENT_CLINIC_OR_DEPARTMENT_OTHER)

## 2024-02-11 ENCOUNTER — Ambulatory Visit (HOSPITAL_BASED_OUTPATIENT_CLINIC_OR_DEPARTMENT_OTHER)
Admission: RE | Admit: 2024-02-11 | Discharge: 2024-02-11 | Disposition: A | Discharge status: Home / Self Care | Source: Ambulatory Visit | Attending: Acute Care | Admitting: Acute Care

## 2024-02-11 DIAGNOSIS — F17210 Nicotine dependence, cigarettes, uncomplicated: Secondary | ICD-10-CM | POA: Diagnosis not present

## 2024-02-11 DIAGNOSIS — Z122 Encounter for screening for malignant neoplasm of respiratory organs: Secondary | ICD-10-CM | POA: Insufficient documentation

## 2024-02-11 DIAGNOSIS — Z87891 Personal history of nicotine dependence: Secondary | ICD-10-CM | POA: Insufficient documentation

## 2024-02-23 ENCOUNTER — Other Ambulatory Visit: Payer: Self-pay

## 2024-02-23 DIAGNOSIS — Z87891 Personal history of nicotine dependence: Secondary | ICD-10-CM

## 2024-02-23 DIAGNOSIS — Z122 Encounter for screening for malignant neoplasm of respiratory organs: Secondary | ICD-10-CM

## 2024-02-23 DIAGNOSIS — F17210 Nicotine dependence, cigarettes, uncomplicated: Secondary | ICD-10-CM
